# Patient Record
Sex: Female | Born: 1975 | Race: White | Hispanic: No | Marital: Single | Smoking: Former smoker
Health system: Southern US, Community
[De-identification: ages and names within clinical notes are randomized; demographics above are authoritative.]

## PROBLEM LIST (undated history)

## (undated) DIAGNOSIS — N83201 Unspecified ovarian cyst, right side: Secondary | ICD-10-CM

## (undated) DIAGNOSIS — N83202 Unspecified ovarian cyst, left side: Secondary | ICD-10-CM

## (undated) DIAGNOSIS — R87629 Unspecified abnormal cytological findings in specimens from vagina: Secondary | ICD-10-CM

## (undated) DIAGNOSIS — N189 Chronic kidney disease, unspecified: Secondary | ICD-10-CM

## (undated) DIAGNOSIS — G43909 Migraine, unspecified, not intractable, without status migrainosus: Secondary | ICD-10-CM

## (undated) HISTORY — DX: Chronic kidney disease, unspecified: N18.9

## (undated) HISTORY — PX: TONSILLECTOMY: SUR1361

## (undated) HISTORY — PX: OVARIAN CYST REMOVAL: SHX89

## (undated) HISTORY — DX: Unspecified abnormal cytological findings in specimens from vagina: R87.629

---

## 2017-06-02 ENCOUNTER — Encounter (HOSPITAL_COMMUNITY): Payer: Self-pay

## 2017-06-02 ENCOUNTER — Encounter (HOSPITAL_COMMUNITY): Payer: Self-pay | Admitting: Emergency Medicine

## 2017-06-02 ENCOUNTER — Emergency Department (HOSPITAL_COMMUNITY)
Admission: EM | Admit: 2017-06-02 | Discharge: 2017-06-02 | Disposition: A | Discharge status: Home / Self Care | Payer: Medicaid - Out of State | Attending: Emergency Medicine | Admitting: Emergency Medicine

## 2017-06-02 ENCOUNTER — Inpatient Hospital Stay (HOSPITAL_COMMUNITY)
Admission: AD | Admit: 2017-06-02 | Discharge: 2017-06-03 | Disposition: A | Discharge status: Home / Self Care | Payer: Medicaid - Out of State | Source: Ambulatory Visit | Attending: Obstetrics & Gynecology | Admitting: Obstetrics & Gynecology

## 2017-06-02 DIAGNOSIS — K5900 Constipation, unspecified: Secondary | ICD-10-CM | POA: Diagnosis not present

## 2017-06-02 DIAGNOSIS — Z79899 Other long term (current) drug therapy: Secondary | ICD-10-CM | POA: Insufficient documentation

## 2017-06-02 DIAGNOSIS — R102 Pelvic and perineal pain: Secondary | ICD-10-CM | POA: Insufficient documentation

## 2017-06-02 DIAGNOSIS — R109 Unspecified abdominal pain: Secondary | ICD-10-CM | POA: Insufficient documentation

## 2017-06-02 DIAGNOSIS — Z888 Allergy status to other drugs, medicaments and biological substances status: Secondary | ICD-10-CM | POA: Diagnosis not present

## 2017-06-02 DIAGNOSIS — Z9889 Other specified postprocedural states: Secondary | ICD-10-CM | POA: Insufficient documentation

## 2017-06-02 DIAGNOSIS — N83202 Unspecified ovarian cyst, left side: Secondary | ICD-10-CM | POA: Diagnosis not present

## 2017-06-02 DIAGNOSIS — Z87891 Personal history of nicotine dependence: Secondary | ICD-10-CM | POA: Diagnosis not present

## 2017-06-02 DIAGNOSIS — K529 Noninfective gastroenteritis and colitis, unspecified: Secondary | ICD-10-CM | POA: Diagnosis not present

## 2017-06-02 DIAGNOSIS — Z5321 Procedure and treatment not carried out due to patient leaving prior to being seen by health care provider: Secondary | ICD-10-CM | POA: Diagnosis not present

## 2017-06-02 DIAGNOSIS — N83201 Unspecified ovarian cyst, right side: Secondary | ICD-10-CM | POA: Insufficient documentation

## 2017-06-02 DIAGNOSIS — N926 Irregular menstruation, unspecified: Secondary | ICD-10-CM | POA: Insufficient documentation

## 2017-06-02 HISTORY — DX: Migraine, unspecified, not intractable, without status migrainosus: G43.909

## 2017-06-02 HISTORY — DX: Unspecified ovarian cyst, right side: N83.201

## 2017-06-02 HISTORY — DX: Unspecified ovarian cyst, left side: N83.202

## 2017-06-02 LAB — URINALYSIS, ROUTINE W REFLEX MICROSCOPIC
Bilirubin Urine: NEGATIVE
Glucose, UA: NEGATIVE mg/dL
Hgb urine dipstick: NEGATIVE
KETONES UR: NEGATIVE mg/dL
LEUKOCYTES UA: NEGATIVE
NITRITE: NEGATIVE
PROTEIN: NEGATIVE mg/dL
Specific Gravity, Urine: 1.018 (ref 1.005–1.030)
pH: 5 (ref 5.0–8.0)

## 2017-06-02 LAB — WET PREP, GENITAL
Sperm: NONE SEEN
Trich, Wet Prep: NONE SEEN
Yeast Wet Prep HPF POC: NONE SEEN

## 2017-06-02 LAB — POCT PREGNANCY, URINE: Preg Test, Ur: NEGATIVE

## 2017-06-02 MED ORDER — HYDROMORPHONE HCL 1 MG/ML IJ SOLN
1.0000 mg | INTRAMUSCULAR | Status: DC | PRN
  Administered 2017-06-03: 1 mg via INTRAVENOUS
  Filled 2017-06-02: qty 1

## 2017-06-02 MED ORDER — ONDANSETRON 4 MG PO TBDP
4.0000 mg | ORAL_TABLET | Freq: Once | ORAL | Status: AC | PRN
  Administered 2017-06-02: 4 mg via ORAL
  Filled 2017-06-02: qty 1

## 2017-06-02 NOTE — MAU Provider Note (Signed)
History     CSN: [id number]  Arrival date and time: 06/02/17 2005  Chief Complaint  Patient presents with  . Pelvic Pain   41 y.o. non-pregnant female here with LLQ pain. Pain started acutely earlier this evening after an episode of vomiting and diarrhea. Describes as constant throbbing pain and intermittent sharp pain in LLQ. Notes constipation prior to the diarrhea. No sick contacts but recent travel from [place]. Denies fevers. No urinary sx. No vaginal discharge. No new partner. Not using contraception, in same sex relationship. Had Nexplanon removed 6 months ago, irregular menses since. Most recent menses was longer and heavier.    Past Medical History:  Diagnosis Date  . Bilateral ovarian cysts   . Migraine     Past Surgical History:  Procedure Laterality Date  . OVARIAN CYST REMOVAL    . TONSILLECTOMY      No family history on file.  Social History   Tobacco Use  . Smoking status: Former Research scientist (life sciences)  . Smokeless tobacco: Never Used  Substance Use Topics  . Alcohol use: Yes    Comment: Occasionally  . Drug use: No    Allergies:  Allergies  Allergen Reactions  . Imitrex [Sumatriptan] Other (See Comments)    Migraine gets worse.     No medications prior to admission.    Review of Systems  Constitutional: Negative for fever.  Gastrointestinal: Positive for abdominal pain, constipation, diarrhea, nausea and vomiting.  Genitourinary: Negative for dysuria, frequency, urgency, vaginal bleeding and vaginal discharge.   Physical Exam   Blood pressure (!) 142/96, pulse 64, temperature 98.4 F (36.9 C), resp. rate 20, height 5\' 3"  (1.6 m), weight 132 lb (59.9 kg), last menstrual period 05/21/2017, SpO2 100 %.  Physical Exam  Constitutional: She is oriented to person, place, and time. She appears well-developed and well-nourished. She appears distressed (moving around, fetal position at times).  HENT:  Head: Normocephalic and atraumatic.  Neck: Normal range of motion.   Cardiovascular: Normal rate.  Respiratory: No respiratory distress.  GI: Soft. She exhibits no distension and no mass. There is no tenderness. There is no rebound and no guarding.  Genitourinary:  Genitourinary Comments: External: no lesions or erythema Vagina: rugated, pink, moist, scant white discharge Uterus: non enlarged, anteverted, non tender, no CMT Adnexae: no masses, + tenderness left, + tenderness right   Musculoskeletal: Normal range of motion.  Neurological: She is alert and oriented to person, place, and time.  Skin: Skin is warm and dry.  Psychiatric: She has a normal mood and affect.   Results for orders placed or performed during the hospital encounter of 06/02/17 (from the past 24 hour(s))  Urinalysis, Routine w reflex microscopic     Status: Abnormal   Collection Time: 06/02/17  8:57 PM  Result Value Ref Range   Color, Urine YELLOW YELLOW   APPearance HAZY (A) CLEAR   Specific Gravity, Urine 1.018 1.005 - 1.030   pH 5.0 5.0 - 8.0   Glucose, UA NEGATIVE NEGATIVE mg/dL   Hgb urine dipstick NEGATIVE NEGATIVE   Bilirubin Urine NEGATIVE NEGATIVE   Ketones, ur NEGATIVE NEGATIVE mg/dL   Protein, ur NEGATIVE NEGATIVE mg/dL   Nitrite NEGATIVE NEGATIVE   Leukocytes, UA NEGATIVE NEGATIVE  Pregnancy, urine POC     Status: None   Collection Time: 06/02/17  9:11 PM  Result Value Ref Range   Preg Test, Ur NEGATIVE NEGATIVE  Wet prep, genital     Status: Abnormal   Collection Time: 06/02/17 11:30 PM  Result Value Ref Range   Yeast Wet Prep HPF POC NONE SEEN NONE SEEN   Trich, Wet Prep NONE SEEN NONE SEEN   Clue Cells Wet Prep HPF POC PRESENT (A) NONE SEEN   WBC, Wet Prep HPF POC MANY (A) NONE SEEN   Sperm NONE SEEN   CBC     Status: None   Collection Time: 06/02/17 11:55 PM  Result Value Ref Range   WBC 6.7 4.0 - 10.5 K/uL   RBC 4.55 3.87 - 5.11 MIL/uL   Hemoglobin 13.7 12.0 - 15.0 g/dL   HCT 40.2 36.0 - 46.0 %   MCV 88.4 78.0 - 100.0 fL   MCH 30.1 26.0 - 34.0 pg    MCHC 34.1 30.0 - 36.0 g/dL   RDW 13.3 11.5 - 15.5 %   Platelets 325 150 - 400 K/uL   US Pelvis Transvanginal Non-ob (tv Only)  Result Date: 06/03/2017 CLINICAL DATA:  41 year old female with acute pelvic pain. EXAM: TRANSABDOMINAL AND TRANSVAGINAL ULTRASOUND OF PELVIS DOPPLER ULTRASOUND OF OVARIES TECHNIQUE: Both transabdominal and transvaginal ultrasound examinations of the pelvis were performed. Transabdominal technique was performed for global imaging of the pelvis including uterus, ovaries, adnexal regions, and pelvic cul-de-sac. It was necessary to proceed with endovaginal exam following the transabdominal exam to visualize the endometrium and ovaries. Color and duplex Doppler ultrasound was utilized to evaluate blood flow to the ovaries. COMPARISON:  None. FINDINGS: Uterus Measurements: 7.9 x 3.5 x 4.2 cm. The uterus is retroverted and appears unremarkable. Endometrium Thickness: 7 mm.  No focal abnormality visualized. Right ovary Measurements: 3.4 x 1.8 x 2.4 cm. Normal appearance/no adnexal mass. Left ovary Measurements: 3.7 x 2.4 x 3.1 cm. Normal appearance/no adnexal mass. Pulsed Doppler evaluation of both ovaries demonstrates normal low-resistance arterial and venous waveforms. Other findings Multiple nondistended fluid-filled loops of small bowel noted which may be physiologic. Clinical correlation is recommended to exclude enteritis. Small free fluid within the pelvis. IMPRESSION: 1. Unremarkable pelvic ultrasound. 2. Nondistended fluid-filled loops of small bowel, likely physiologic. Clinical correlation is recommended to exclude enteritis. Electronically Signed   By: Anner Crete M.D.   On: 06/03/2017 01:16   US Pelvis (transabdominal Only)  Result Date: 06/03/2017 CLINICAL DATA:  41 year old female with acute pelvic pain. EXAM: TRANSABDOMINAL AND TRANSVAGINAL ULTRASOUND OF PELVIS DOPPLER ULTRASOUND OF OVARIES TECHNIQUE: Both transabdominal and transvaginal ultrasound examinations  of the pelvis were performed. Transabdominal technique was performed for global imaging of the pelvis including uterus, ovaries, adnexal regions, and pelvic cul-de-sac. It was necessary to proceed with endovaginal exam following the transabdominal exam to visualize the endometrium and ovaries. Color and duplex Doppler ultrasound was utilized to evaluate blood flow to the ovaries. COMPARISON:  None. FINDINGS: Uterus Measurements: 7.9 x 3.5 x 4.2 cm. The uterus is retroverted and appears unremarkable. Endometrium Thickness: 7 mm.  No focal abnormality visualized. Right ovary Measurements: 3.4 x 1.8 x 2.4 cm. Normal appearance/no adnexal mass. Left ovary Measurements: 3.7 x 2.4 x 3.1 cm. Normal appearance/no adnexal mass. Pulsed Doppler evaluation of both ovaries demonstrates normal low-resistance arterial and venous waveforms. Other findings Multiple nondistended fluid-filled loops of small bowel noted which may be physiologic. Clinical correlation is recommended to exclude enteritis. Small free fluid within the pelvis. IMPRESSION: 1. Unremarkable pelvic ultrasound. 2. Nondistended fluid-filled loops of small bowel, likely physiologic. Clinical correlation is recommended to exclude enteritis. Electronically Signed   By: Anner Crete M.D.   On: 06/03/2017 01:16   US Pelvic Doppler (torsion R/o Or Mass  Arterial Flow)  Result Date: 06/03/2017 CLINICAL DATA:  41 year old female with acute pelvic pain. EXAM: TRANSABDOMINAL AND TRANSVAGINAL ULTRASOUND OF PELVIS DOPPLER ULTRASOUND OF OVARIES TECHNIQUE: Both transabdominal and transvaginal ultrasound examinations of the pelvis were performed. Transabdominal technique was performed for global imaging of the pelvis including uterus, ovaries, adnexal regions, and pelvic cul-de-sac. It was necessary to proceed with endovaginal exam following the transabdominal exam to visualize the endometrium and ovaries. Color and duplex Doppler ultrasound was utilized to evaluate  blood flow to the ovaries. COMPARISON:  None. FINDINGS: Uterus Measurements: 7.9 x 3.5 x 4.2 cm. The uterus is retroverted and appears unremarkable. Endometrium Thickness: 7 mm.  No focal abnormality visualized. Right ovary Measurements: 3.4 x 1.8 x 2.4 cm. Normal appearance/no adnexal mass. Left ovary Measurements: 3.7 x 2.4 x 3.1 cm. Normal appearance/no adnexal mass. Pulsed Doppler evaluation of both ovaries demonstrates normal low-resistance arterial and venous waveforms. Other findings Multiple nondistended fluid-filled loops of small bowel noted which may be physiologic. Clinical correlation is recommended to exclude enteritis. Small free fluid within the pelvis. IMPRESSION: 1. Unremarkable pelvic ultrasound. 2. Nondistended fluid-filled loops of small bowel, likely physiologic. Clinical correlation is recommended to exclude enteritis. Electronically Signed   By: Anner Crete M.D.   On: 06/03/2017 01:16   MAU Course  Procedures Dilaudid  MDM Labs and [place] ordered and reviewed. Pain improved after Dilaudid. No evidence of acute pelvic process. Sx likely caused by gastroenteritis. Discussed supportive measures. Stable for discharge home.  Assessment and Plan   1. Gastroenteritis   2. Acute pelvic pain    Discharge home Hydrate Rest BRAT diet Rx Zofran Rx Bentyl Imodium OTC prn Return for worsening sx  Allergies as of 06/03/2017      Reactions   Imitrex [sumatriptan] Other (See Comments)   Migraine gets worse.       Medication List    TAKE these medications   dicyclomine 10 MG capsule Commonly known as:  BENTYL Take 1 capsule (10 mg total) by mouth 3 (three) times daily.   ondansetron 8 MG disintegrating tablet Commonly known as:  ZOFRAN ODT Take 1 tablet (8 mg total) by mouth every 8 (eight) hours as needed for nausea or vomiting.        Aunesty Handler, CNM 06/02/2017, 11:40 PM

## 2017-06-02 NOTE — ED Notes (Signed)
Called patient for assigned room with no answer.  

## 2017-06-02 NOTE — MAU Note (Signed)
Pt reports sudden onset of nausea, vomiting and diarrhea. Soon after started having sharp, stabbing, left lower quadrant pain. States she has taken zofran for nausea, which has helped. Has not taken anything for pain. Was at [facility], but left due to wait time. Pt denies fever.

## 2017-06-02 NOTE — ED Triage Notes (Signed)
Pt comes with complaints of left lower abdominal pain that radiates upwards that began suddenly around 1400. Endorses nausea, vomiting, and diarrhea.  Took 4 mg of zofran around 1500 with some relief. Rates pain 10/10.

## 2017-06-02 NOTE — ED Notes (Signed)
Writer called for blood draws, no response

## 2017-06-02 NOTE — MAU Note (Signed)
Pt states nausea at 1400 lasting 1.5 hrs., took zofran and it resolves; pain began 1430. Went to [facility] ER; they sent her here.

## 2017-06-03 ENCOUNTER — Inpatient Hospital Stay (HOSPITAL_COMMUNITY): Payer: Medicaid - Out of State

## 2017-06-03 DIAGNOSIS — K529 Noninfective gastroenteritis and colitis, unspecified: Secondary | ICD-10-CM

## 2017-06-03 DIAGNOSIS — R102 Pelvic and perineal pain: Secondary | ICD-10-CM

## 2017-06-03 LAB — CBC
HCT: 40.2 % (ref 36.0–46.0)
HEMOGLOBIN: 13.7 g/dL (ref 12.0–15.0)
MCH: 30.1 pg (ref 26.0–34.0)
MCHC: 34.1 g/dL (ref 30.0–36.0)
MCV: 88.4 fL (ref 78.0–100.0)
Platelets: 325 10*3/uL (ref 150–400)
RBC: 4.55 MIL/uL (ref 3.87–5.11)
RDW: 13.3 % (ref 11.5–15.5)
WBC: 6.7 10*3/uL (ref 4.0–10.5)

## 2017-06-03 LAB — GC/CHLAMYDIA PROBE AMP (~~LOC~~) NOT AT ARMC
CHLAMYDIA, DNA PROBE: NEGATIVE
NEISSERIA GONORRHEA: NEGATIVE

## 2017-06-03 IMAGING — US US ART/VEN ABD/PELV/SCROTUM DOPPLER LTD
1 series · 15 of 25 positions shown · non-contrast
Comparison: None.

CLINICAL DATA: 41-year-old female with acute pelvic pain.

EXAM:
TRANSABDOMINAL AND TRANSVAGINAL ULTRASOUND OF PELVIS
DOPPLER ULTRASOUND OF OVARIES
TECHNIQUE: Both transabdominal and transvaginal ultrasound examinations of the
pelvis were performed. Transabdominal technique was performed for
global imaging of the pelvis including uterus, ovaries, adnexal
regions, and pelvic cul-de-sac.
It was necessary to proceed with endovaginal exam following the
transabdominal exam to visualize the endometrium and ovaries. Color
and duplex Doppler ultrasound was utilized to evaluate blood flow to
the ovaries.

[Series 1: us art/ven abd/pelv/scrotum doppler ltd · 15 of 58 slices shown]
[im 1/58]
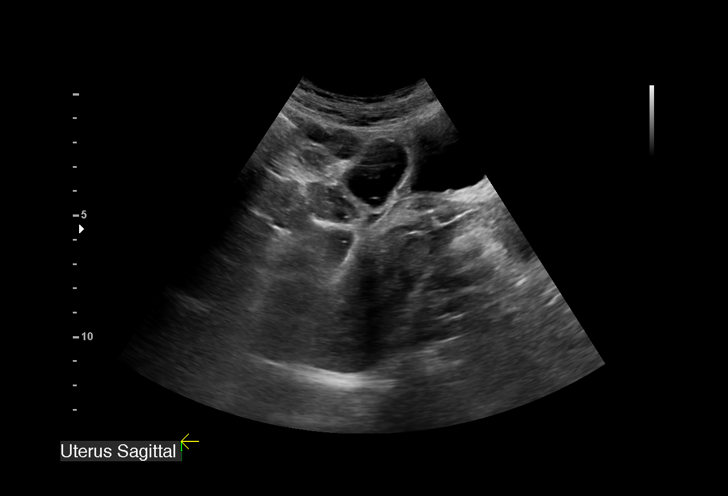
[im 5/58]
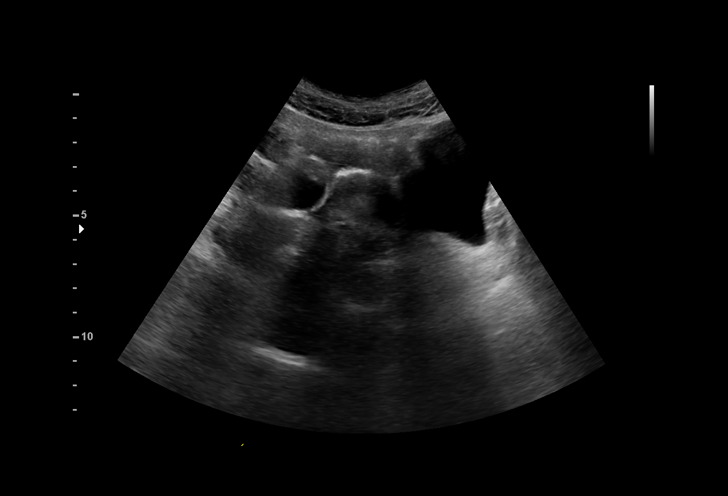
[im 10/58]
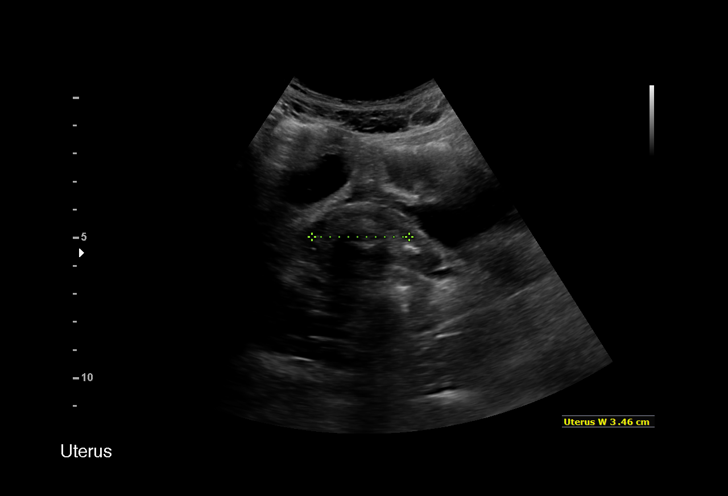
[im 12/58]
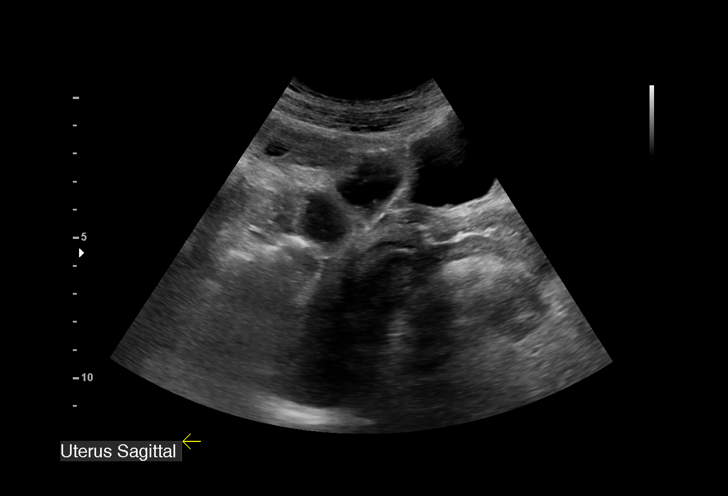
[im 17/58]
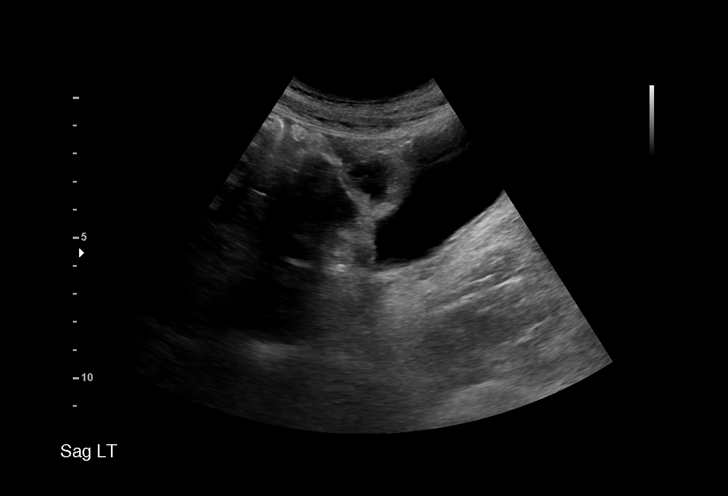
[im 22/58]
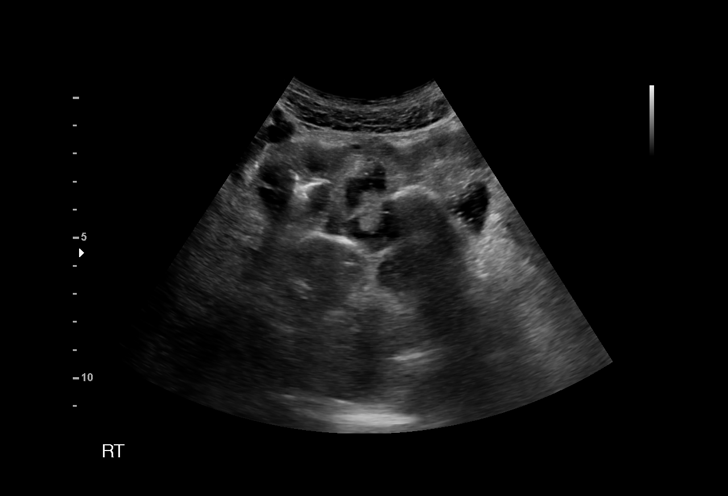
[im 24/58]
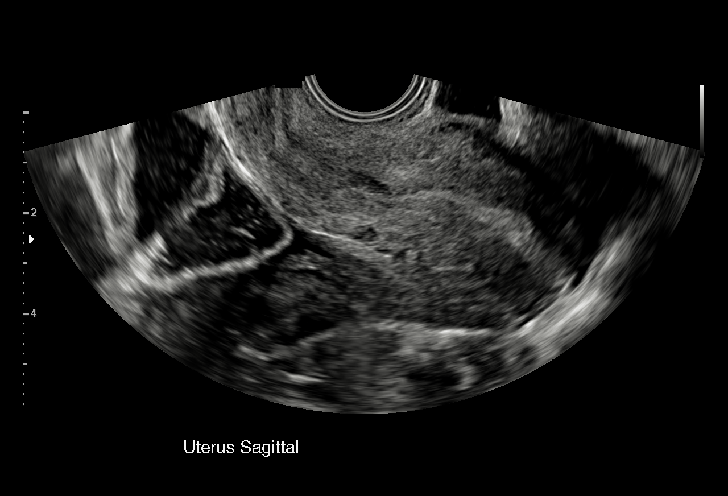
[im 29/58]
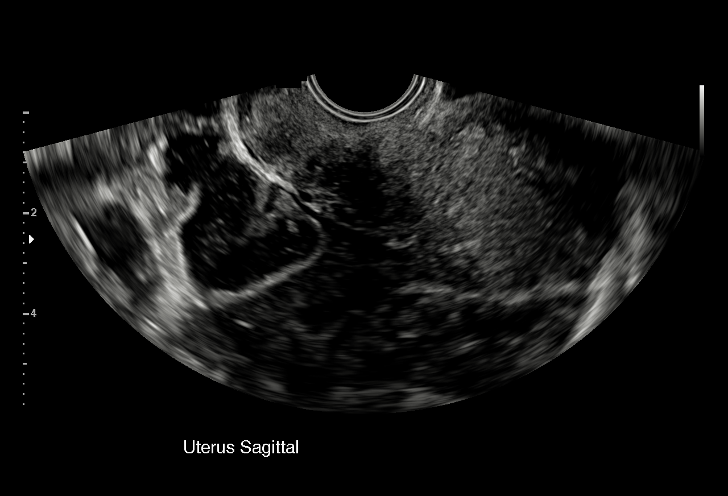
[im 34/58]
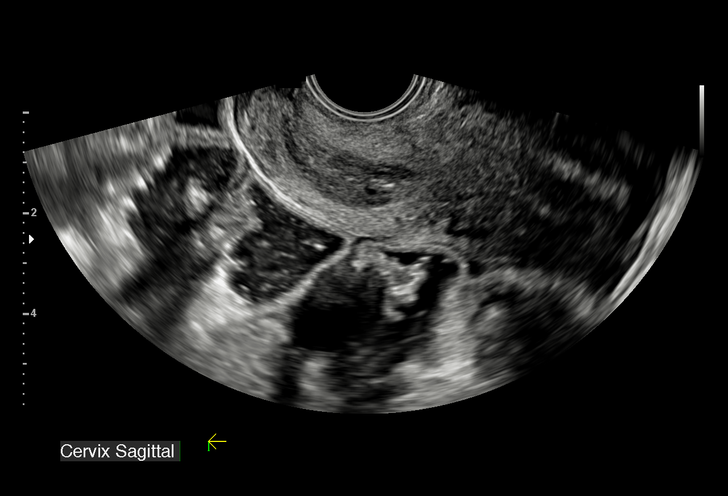
[im 36/58]
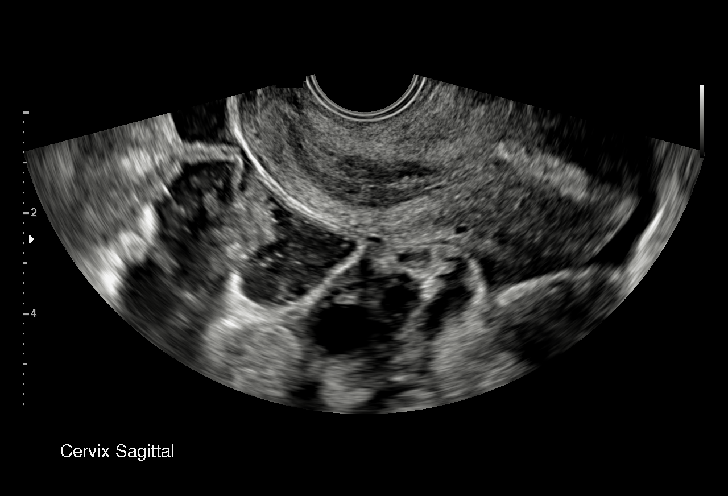
[im 41/58]
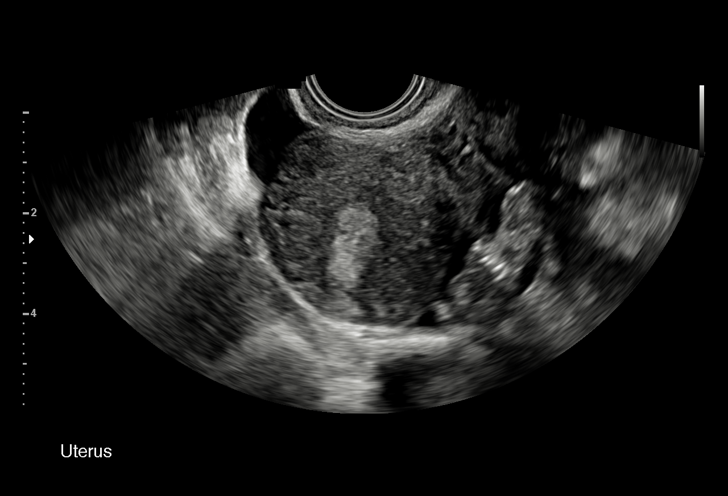
[im 46/58]
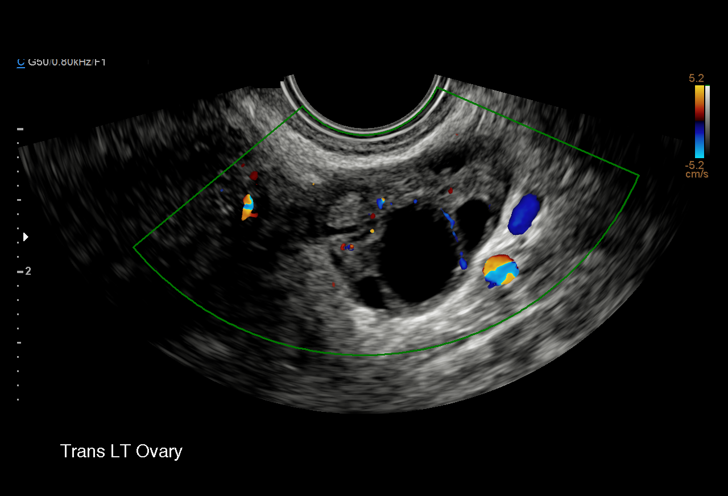
[im 48/58]
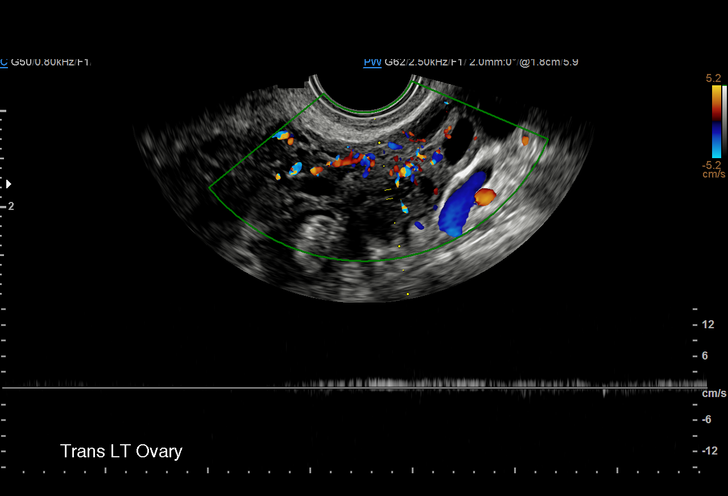
[im 53/58]
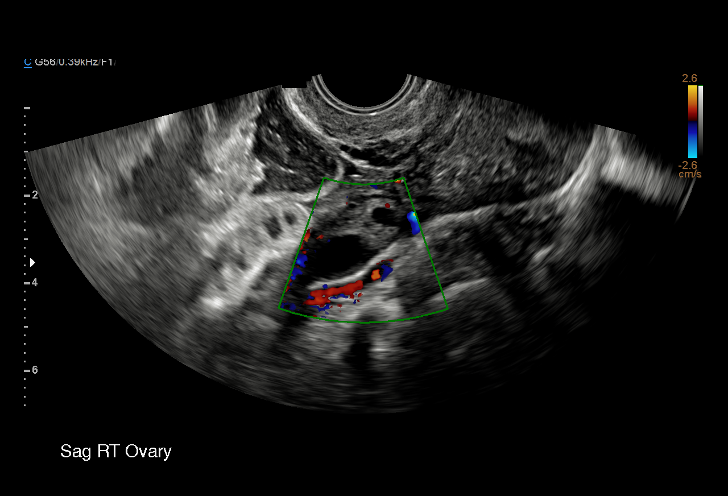
[im 58/58]
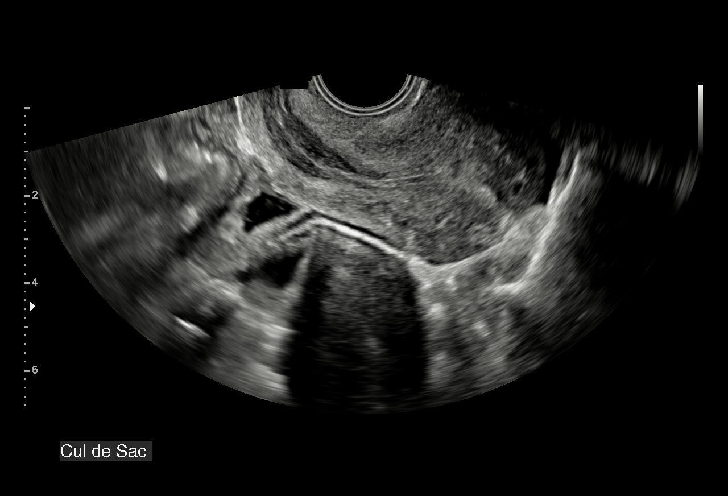

[15 of 25 positions shown; findings below may reference images not displayed]

FINDINGS: Uterus

Measurements: 7.9 x 3.5 x 4.2 cm. The uterus is retroverted and
appears unremarkable.

Endometrium

Thickness: 7 mm.  No focal abnormality visualized.

Right ovary

Measurements: 3.4 x 1.8 x 2.4 cm. Normal appearance/no adnexal mass.

Left ovary

Measurements: 3.7 x 2.4 x 3.1 cm. Normal appearance/no adnexal mass.

Pulsed Doppler evaluation of both ovaries demonstrates normal
low-resistance arterial and venous waveforms.

Other findings

Multiple nondistended fluid-filled loops of small bowel noted which
may be physiologic. Clinical correlation is recommended to exclude
enteritis.

Small free fluid within the pelvis.
IMPRESSION: 1. Unremarkable pelvic ultrasound.
2. Nondistended fluid-filled loops of small bowel, likely
physiologic. Clinical correlation is recommended to exclude
enteritis.

## 2017-06-03 MED ORDER — DICYCLOMINE HCL 10 MG PO CAPS: 10.0000 mg | ORAL_CAPSULE | Freq: Three times a day (TID) | ORAL | 0 refills | Status: DC

## 2017-06-03 MED ORDER — ONDANSETRON 8 MG PO TBDP: 8.0000 mg | ORAL_TABLET | Freq: Three times a day (TID) | ORAL | 0 refills | Status: DC | PRN

## 2017-06-03 MED ORDER — DICYCLOMINE HCL 10 MG PO CAPS
10.0000 mg | ORAL_CAPSULE | Freq: Three times a day (TID) | ORAL | Status: DC
  Administered 2017-06-03: 10 mg via ORAL
  Filled 2017-06-03 (×3): qty 1

## 2017-06-03 NOTE — Discharge Instructions (Signed)
Bland Diet A bland diet consists of foods that do not have a lot of fat or fiber. Foods without fat or fiber are easier for the body to digest. They are also less likely to irritate your mouth, throat, stomach, and other parts of your gastrointestinal tract. A bland diet is sometimes called a BRAT diet. What is my plan? Your health care provider or dietitian may recommend specific changes to your diet to prevent and treat your symptoms, such as:  Eating small meals often.  Cooking food until it is soft enough to chew easily.  Chewing your food well.  Drinking fluids slowly.  Not eating foods that are very spicy, sour, or fatty.  Not eating citrus fruits, such as oranges and grapefruit.  What do I need to know about this diet?  Eat a variety of foods from the bland diet food list.  Do not follow a bland diet longer than you have to.  Ask your health care provider whether you should take vitamins. What foods can I eat? Grains  Hot cereals, such as cream of wheat. Bread, crackers, or tortillas made from refined white flour. Rice. Vegetables Canned or cooked vegetables. Mashed or boiled potatoes. Fruits Bananas. Applesauce. Other types of cooked or canned fruit with the skin and seeds removed, such as canned peaches or pears. Meats and Other Protein Sources Scrambled eggs. Creamy peanut butter or other nut butters. Lean, well-cooked meats, such as chicken or fish. Tofu. Soups or broths. Dairy Low-fat dairy products, such as milk, cottage cheese, or yogurt. Beverages Water. Herbal tea. Apple juice. Sweets and Desserts Pudding. Custard. Fruit gelatin. Ice cream. Fats and Oils Mild salad dressings. Canola or olive oil. The items listed above may not be a complete list of allowed foods or beverages. Contact your dietitian for more options. What foods are not recommended? Foods and ingredients that are often not recommended include:  Spicy foods, such as hot sauce or  salsa.  Fried foods.  Sour foods, such as pickled or fermented foods.  Raw vegetables or fruits, especially citrus or berries.  Caffeinated drinks.  Alcohol.  Strongly flavored seasonings or condiments.  The items listed above may not be a complete list of foods and beverages that are not allowed. Contact your dietitian for more information. This information is not intended to replace advice given to you by your health care provider. Make sure you discuss any questions you have with your health care provider. Document Released: 09/16/2015 Document Revised: 10/31/2015 Document Reviewed: 06/06/2014 Elsevier Interactive Patient Education  2018 [employer] Viral Gastroenteritis, Adult Viral gastroenteritis is also known as the stomach flu. This condition is caused by certain germs (viruses). These germs can be passed from person to person very easily (are very contagious). This condition can cause sudden watery poop (diarrhea), fever, and throwing up (vomiting). Having watery poop and throwing up can make you feel weak and cause you to get dehydrated. Dehydration can make you tired and thirsty, make you have a dry mouth, and make it so you pee (urinate) less often. Older adults and people with other diseases or a weak defense system (immune system) are at higher risk for dehydration. It is important to replace the fluids that you lose from having watery poop and throwing up. Follow these instructions at home: Follow instructions from your doctor about how to care for yourself at home. Eating and drinking  Follow these instructions as told by your doctor:  Take an oral rehydration solution (ORS). This is   a drink that is sold at pharmacies and stores.  Drink clear fluids in small amounts as you are able, such as: ? Water. ? Ice chips. ? Diluted fruit juice. ? Low-calorie sports drinks.  Eat bland, easy-to-digest foods in small amounts as you are able, such  as: ? Bananas. ? Applesauce. ? Rice. ? Low-fat (lean) meats. ? Toast. ? Crackers.  Avoid fluids that have a lot of sugar or caffeine in them.  Avoid alcohol.  Avoid spicy or fatty foods.  General instructions  Drink enough fluid to keep your pee (urine) clear or pale yellow.  Wash your hands often. If you cannot use soap and water, use hand sanitizer.  Make sure that all people in your home wash their hands well and often.  Rest at home while you get better.  Take over-the-counter and prescription medicines only as told by your doctor.  Watch your condition for any changes.  Take a warm bath to help with any burning or pain from having watery poop.  Keep all follow-up visits as told by your doctor. This is important. Contact a doctor if:  You cannot keep fluids down.  Your symptoms get worse.  You have new symptoms.  You feel light-headed or dizzy.  You have muscle cramps. Get help right away if:  You have chest pain.  You feel very weak or you pass out (faint).  You see blood in your throw-up.  Your throw-up looks like coffee grounds.  You have bloody or black poop (stools) or poop that look like tar.  You have a very bad headache, a stiff neck, or both.  You have a rash.  You have very bad pain, cramping, or bloating in your belly (abdomen).  You have trouble breathing.  You are breathing very quickly.  Your heart is beating very quickly.  Your skin feels cold and clammy.  You feel confused.  You have pain when you pee.  You have signs of dehydration, such as: ? Dark pee, hardly any pee, or no pee. ? Cracked lips. ? Dry mouth. ? Sunken eyes. ? Sleepiness. ? Weakness. This information is not intended to replace advice given to you by your health care provider. Make sure you discuss any questions you have with your health care provider. Document Released: 11/11/2007 Document Revised: 12/13/2015 Document Reviewed: 01/29/2015 Elsevier  Interactive Patient Education  2017 [employer] Inc[employer]  

## 2018-10-15 ENCOUNTER — Other Ambulatory Visit: Payer: Self-pay

## 2018-10-15 ENCOUNTER — Ambulatory Visit (HOSPITAL_COMMUNITY)
Admission: EM | Admit: 2018-10-15 | Discharge: 2018-10-15 | Disposition: A | Discharge status: Home / Self Care | Payer: Medicaid Other | Attending: Family Medicine | Admitting: Family Medicine

## 2018-10-15 DIAGNOSIS — R350 Frequency of micturition: Secondary | ICD-10-CM

## 2018-10-15 DIAGNOSIS — M545 Low back pain, unspecified: Secondary | ICD-10-CM

## 2018-10-15 LAB — POCT URINALYSIS DIP (DEVICE)
Bilirubin Urine: NEGATIVE
Glucose, UA: NEGATIVE mg/dL
Hgb urine dipstick: NEGATIVE
Ketones, ur: NEGATIVE mg/dL
Leukocytes,Ua: NEGATIVE
Nitrite: NEGATIVE
Protein, ur: NEGATIVE mg/dL
Specific Gravity, Urine: 1.01 (ref 1.005–1.030)
Urobilinogen, UA: 0.2 mg/dL (ref 0.0–1.0)
pH: 7 (ref 5.0–8.0)

## 2018-10-15 LAB — POCT PREGNANCY, URINE: Preg Test, Ur: NEGATIVE

## 2018-10-15 MED ORDER — CYCLOBENZAPRINE HCL 5 MG PO TABS: 5.0000 mg | ORAL_TABLET | Freq: Three times a day (TID) | ORAL | 0 refills | Status: DC | PRN

## 2018-10-15 MED ORDER — PREDNISONE 20 MG PO TABS: 40.0000 mg | ORAL_TABLET | Freq: Every day | ORAL | 0 refills | Status: AC

## 2018-10-15 NOTE — ED Provider Notes (Signed)
[facility]    CSN: [id number] Arrival date & time: 10/15/18  1128     History    Musculoskeletal Exam  Patient: Vanessa Reese DOB: [birth year]  DOS: 10/15/2018  SUBJECTIVE:  Chief Complaint:   Chief Complaint  Patient presents with  . Flank Pain    Vanessa Reese is a 43 y.o.  female for evaluation and treatment of b/l blank pain.   Onset:  1 week ago. No inj or change in activity.  Location:  Character:  aching  Progression of issue:  is unchanged Associated symptoms: urinary freq, incomplete emptying Denies fevers, bleeding, abd pain, vag dc, dysuria Treatment: to date has been Tylenol and heat.   Neurovascular symptoms: no  Last A1c was 5.3. Told she has CKD 3, labs show CKD 2.  Has never had UTI that felt like this. Has never had pyeloneph.  ROS: Musculoskeletal/Extremities: +flank pain GU: No dysuria  Past Medical History:  Diagnosis Date  . Bilateral ovarian cysts   . Migraine     Objective: VITAL SIGNS: BP (!) 152/89 (BP Location: Right Arm)   Pulse 74   Temp 98.4 F (36.9 C) (Oral)   Resp 16   SpO2 99%  Constitutional: Well formed, well developed. No acute distress. Cardiovascular: Brisk cap refill Thorax & Lungs: No accessory muscle use Musculoskeletal: low back.   Tenderness to palpation: Yes, b/l parasp musculature, worse on R Deformity: no Ecchymosis: no Tests negative: Straight leg Poor hamstring flexibility Neurologic: Normal sensory function. No focal deficits noted. DTR's equal and symmetric in LE's. No clonus. Psychiatric: Normal mood. Age appropriate judgment and insight. Alert & oriented x 3.  Final Clinical Impressions(s) / UC Diagnoses   Final diagnoses:  Acute bilateral low back pain without sciatica  Urinary frequency   Tx for msk etiology. Will unfortunately have to f/u with PCP regarding her fatigue and freq. Further instructions as below.    Discharge Instructions     Heat (pad or rice pillow in  microwave) over affected area, 10-15 minutes twice daily.   OK to take Tylenol 1000 mg (2 extra strength tabs) or 975 mg (3 regular strength tabs) every 6 hours as needed.  Take Flexeril (cyclobenzaprine) 1-2 hours before planned bedtime. If it makes you drowsy, do not take during the day. You can try half a tab the following night.  EXERCISES  RANGE OF MOTION (ROM) AND STRETCHING EXERCISES - Low Back Pain Most people with lower back pain will find that their symptoms get worse with excessive bending forward (flexion) or arching at the lower back (extension). The exercises that will help resolve your symptoms will focus on the opposite motion.  If you have pain, numbness or tingling which travels down into your buttocks, leg or foot, the goal of the therapy is for these symptoms to move closer to your back and eventually resolve. Sometimes, these leg symptoms will get better, but your lower back pain may worsen. This is often an indication of progress in your rehabilitation. Be very alert to any changes in your symptoms and the activities in which you participated in the 24 hours prior to the change. Sharing this information with your caregiver will allow him or her to most efficiently treat your condition. These exercises may help you when beginning to rehabilitate your injury. Your symptoms may resolve with or without further involvement from your physician, physical therapist or athletic trainer. While completing these exercises, remember:   Restoring tissue flexibility helps normal motion to return  to the joints. This allows healthier, less painful movement and activity.  An effective stretch should be held for at least 30 seconds.  A stretch should never be painful. You should only feel a gentle lengthening or release in the stretched tissue. FLEXION RANGE OF MOTION AND STRETCHING EXERCISES:  STRETCH - Flexion, Single Knee to Chest   Lie on a firm bed or floor with both legs extended in  front of you.  Keeping one leg in contact with the floor, bring your opposite knee to your chest. Hold your leg in place by either grabbing behind your thigh or at your knee.  Pull until you feel a gentle stretch in your low back. Hold 30 seconds.  Slowly release your grasp and repeat the exercise with the opposite side. Repeat 2 times. Complete this exercise 3 times per week.   STRETCH - Flexion, Double Knee to Chest  Lie on a firm bed or floor with both legs extended in front of you.  Keeping one leg in contact with the floor, bring your opposite knee to your chest.  Tense your stomach muscles to support your back and then lift your other knee to your chest. Hold your legs in place by either grabbing behind your thighs or at your knees.  Pull both knees toward your chest until you feel a gentle stretch in your low back. Hold 30 seconds.  Tense your stomach muscles and slowly return one leg at a time to the floor. Repeat 2 times. Complete this exercise 3 times per week.   STRETCH - Low Trunk Rotation  Lie on a firm bed or floor. Keeping your legs in front of you, bend your knees so they are both pointed toward the ceiling and your feet are flat on the floor.  Extend your arms out to the side. This will stabilize your upper body by keeping your shoulders in contact with the floor.  Gently and slowly drop both knees together to one side until you feel a gentle stretch in your low back. Hold for 30 seconds.  Tense your stomach muscles to support your lower back as you bring your knees back to the starting position. Repeat the exercise to the other side. Repeat 2 times. Complete this exercise at least 3 times per week.   EXTENSION RANGE OF MOTION AND FLEXIBILITY EXERCISES:  STRETCH - Extension, Prone on Elbows   Lie on your stomach on the floor, a bed will be too soft. Place your palms about shoulder width apart and at the height of your head.  Place your elbows under your  shoulders. If this is too painful, stack pillows under your chest.  Allow your body to relax so that your hips drop lower and make contact more completely with the floor.  Hold this position for 30 seconds.  Slowly return to lying flat on the floor. Repeat 2 times. Complete this exercise 3 times per week.   RANGE OF MOTION - Extension, Prone Press Ups  Lie on your stomach on the floor, a bed will be too soft. Place your palms about shoulder width apart and at the height of your head.  Keeping your back as relaxed as possible, slowly straighten your elbows while keeping your hips on the floor. You may adjust the placement of your hands to maximize your comfort. As you gain motion, your hands will come more underneath your shoulders.  Hold this position 30 seconds.  Slowly return to lying flat on the floor. Repeat 2  times. Complete this exercise 3 times per week.   RANGE OF MOTION- Quadruped, Neutral Spine   Assume a hands and knees position on a firm surface. Keep your hands under your shoulders and your knees under your hips. You may place padding under your knees for comfort.  Drop your head and point your tailbone toward the ground below you. This will round out your lower back like an angry cat. Hold this position for 30 seconds.  Slowly lift your head and release your tail bone so that your back sags into a large arch, like an old horse.  Hold this position for 30 seconds.  Repeat this until you feel limber in your low back.  Now, find your "sweet spot." This will be the most comfortable position somewhere between the two previous positions. This is your neutral spine. Once you have found this position, tense your stomach muscles to support your low back.  Hold this position for 30 seconds. Repeat 2 times. Complete this exercise 3 times per week.   STRENGTHENING EXERCISES - Low Back Sprain These exercises may help you when beginning to rehabilitate your injury. These  exercises should be done near your "sweet spot." This is the neutral, low-back arch, somewhere between fully rounded and fully arched, that is your least painful position. When performed in this safe range of motion, these exercises can be used for people who have either a flexion or extension based injury. These exercises may resolve your symptoms with or without further involvement from your physician, physical therapist or athletic trainer. While completing these exercises, remember:   Muscles can gain both the endurance and the strength needed for everyday activities through controlled exercises.  Complete these exercises as instructed by your physician, physical therapist or athletic trainer. Increase the resistance and repetitions only as guided.  You may experience muscle soreness or fatigue, but the pain or discomfort you are trying to eliminate should never worsen during these exercises. If this pain does worsen, stop and make certain you are following the directions exactly. If the pain is still present after adjustments, discontinue the exercise until you can discuss the trouble with your caregiver.  STRENGTHENING - Deep Abdominals, Pelvic Tilt   Lie on a firm bed or floor. Keeping your legs in front of you, bend your knees so they are both pointed toward the ceiling and your feet are flat on the floor.  Tense your lower abdominal muscles to press your low back into the floor. This motion will rotate your pelvis so that your tail bone is scooping upwards rather than pointing at your feet or into the floor. With a gentle tension and even breathing, hold this position for 3 seconds. Repeat 2 times. Complete this exercise 3 times per week.   STRENGTHENING - Abdominals, Crunches   Lie on a firm bed or floor. Keeping your legs in front of you, bend your knees so they are both pointed toward the ceiling and your feet are flat on the floor. Cross your arms over your chest.  Slightly tip your  chin down without bending your neck.  Tense your abdominals and slowly lift your trunk high enough to just clear your shoulder blades. Lifting higher can put excessive stress on the lower back and does not further strengthen your abdominal muscles.  Control your return to the starting position. Repeat 2 times. Complete this exercise 3 times per week.   STRENGTHENING - Quadruped, Opposite UE/LE Lift   Assume a hands and knees  position on a firm surface. Keep your hands under your shoulders and your knees under your hips. You may place padding under your knees for comfort.  Find your neutral spine and gently tense your abdominal muscles so that you can maintain this position. Your shoulders and hips should form a rectangle that is parallel with the floor and is not twisted.  Keeping your trunk steady, lift your right hand no higher than your shoulder and then your left leg no higher than your hip. Make sure you are not holding your breath. Hold this position for 30 seconds.  Continuing to keep your abdominal muscles tense and your back steady, slowly return to your starting position. Repeat with the opposite arm and leg. Repeat 2 times. Complete this exercise 3 times per week.   STRENGTHENING - Abdominals and Quadriceps, Straight Leg Raise   Lie on a firm bed or floor with both legs extended in front of you.  Keeping one leg in contact with the floor, bend the other knee so that your foot can rest flat on the floor.  Find your neutral spine, and tense your abdominal muscles to maintain your spinal position throughout the exercise.  Slowly lift your straight leg off the floor about 6 inches for a count of 3, making sure to not hold your breath.  Still keeping your neutral spine, slowly lower your leg all the way to the floor. Repeat this exercise with each leg 2 times. Complete this exercise 3 times per week.  POSTURE AND BODY MECHANICS CONSIDERATIONS - Low Back Sprain Keeping correct  posture when sitting, standing or completing your activities will reduce the stress put on different body tissues, allowing injured tissues a chance to heal and limiting painful experiences. The following are general guidelines for improved posture.  While reading these guidelines, remember:  The exercises prescribed by your provider will help you have the flexibility and strength to maintain correct postures.  The correct posture provides the best environment for your joints to work. All of your joints have less wear and tear when properly supported by a spine with good posture. This means you will experience a healthier, less painful body.  Correct posture must be practiced with all of your activities, especially prolonged sitting and standing. Correct posture is as important when doing repetitive low-stress activities (typing) as it is when doing a single heavy-load activity (lifting).  RESTING POSITIONS Consider which positions are most painful for you when choosing a resting position. If you have pain with flexion-based activities (sitting, bending, stooping, squatting), choose a position that allows you to rest in a less flexed posture. You would want to avoid curling into a fetal position on your side. If your pain worsens with extension-based activities (prolonged standing, working overhead), avoid resting in an extended position such as sleeping on your stomach. Most people will find more comfort when they rest with their spine in a more neutral position, neither too rounded nor too arched. Lying on a non-sagging bed on your side with a pillow between your knees, or on your back with a pillow under your knees will often provide some relief. Keep in mind, being in any one position for a prolonged period of time, no matter how correct your posture, can still lead to stiffness.  PROPER SITTING POSTURE In order to minimize stress and discomfort on your spine, you must sit with correct posture.  Sitting with good posture should be effortless for a healthy body. Returning to good posture is  a gradual process. Many people can work toward this most comfortably by using various supports until they have the flexibility and strength to maintain this posture on their own. When sitting with proper posture, your ears will fall over your shoulders and your shoulders will fall over your hips. You should use the back of the chair to support your upper back. Your lower back will be in a neutral position, just slightly arched. You may place a small pillow or folded towel at the base of your lower back for  support.  When working at a desk, create an environment that supports good, upright posture. Without extra support, muscles tire, which leads to excessive strain on joints and other tissues. Keep these recommendations in mind:  CHAIR:  A chair should be able to slide under your desk when your back makes contact with the back of the chair. This allows you to work closely.  The chair's height should allow your eyes to be level with the upper part of your monitor and your hands to be slightly lower than your elbows.  BODY POSITION  Your feet should make contact with the floor. If this is not possible, use a foot rest.  Keep your ears over your shoulders. This will reduce stress on your neck and low back.  INCORRECT SITTING POSTURES  If you are feeling tired and unable to assume a healthy sitting posture, do not slouch or slump. This puts excessive strain on your back tissues, causing more damage and pain. Healthier options include:  Using more support, like a lumbar pillow.  Switching tasks to something that requires you to be upright or walking.  Talking a brief walk.  Lying down to rest in a neutral-spine position.  PROLONGED STANDING WHILE SLIGHTLY LEANING FORWARD  When completing a task that requires you to lean forward while standing in one place for a long time, place either foot up on a  stationary 2-4 inch high object to help maintain the best posture. When both feet are on the ground, the lower back tends to lose its slight inward curve. If this curve flattens (or becomes too large), then the back and your other joints will experience too much stress, tire more quickly, and can cause pain.  CORRECT STANDING POSTURES Proper standing posture should be assumed with all daily activities, even if they only take a few moments, like when brushing your teeth. As in sitting, your ears should fall over your shoulders and your shoulders should fall over your hips. You should keep a slight tension in your abdominal muscles to brace your spine. Your tailbone should point down to the ground, not behind your body, resulting in an over-extended swayback posture.   INCORRECT STANDING POSTURES  Common incorrect standing postures include a forward head, locked knees and/or an excessive swayback. WALKING Walk with an upright posture. Your ears, shoulders and hips should all line-up.  PROLONGED ACTIVITY IN A FLEXED POSITION When completing a task that requires you to bend forward at your waist or lean over a low surface, try to find a way to stabilize 3 out of 4 of your limbs. You can place a hand or elbow on your thigh or rest a knee on the surface you are reaching across. This will provide you more stability, so that your muscles do not tire as quickly. By keeping your knees relaxed, or slightly bent, you will also reduce stress across your lower back. CORRECT LIFTING TECHNIQUES  DO :  Assume a wide  stance. This will provide you more stability and the opportunity to get as close as possible to the object which you are lifting.  Tense your abdominals to brace your spine. Bend at the knees and hips. Keeping your back locked in a neutral-spine position, lift using your leg muscles. Lift with your legs, keeping your back straight.  Test the weight of unknown objects before attempting to lift them.   Try to keep your elbows locked down at your sides in order get the best strength from your shoulders when carrying an object.     Always ask for help when lifting heavy or awkward objects. INCORRECT LIFTING TECHNIQUES DO NOT:   Lock your knees when lifting, even if it is a small object.  Bend and twist. Pivot at your feet or move your feet when needing to change directions.  Assume that you can safely pick up even a paperclip without proper posture.     ED Prescriptions    Medication Sig Dispense Auth. Provider   cyclobenzaprine (FLEXERIL) 5 MG tablet Take 1 tablet (5 mg total) by mouth 3 (three) times daily as needed for muscle spasms. 21 tablet Shelda Pal, DO   predniSONE (DELTASONE) 20 MG tablet Take 2 tablets (40 mg total) by mouth daily with breakfast for 5 days. 10 tablet Shelda Pal, DO     Controlled Substance Prescriptions [place] Controlled Substance Registry consulted? Not Applicable   Shelda Pal, DO 10/15/18 1245

## 2018-10-15 NOTE — Discharge Instructions (Signed)
Heat (pad or rice pillow in microwave) over affected area, 10-15 minutes twice daily.   OK to take Tylenol 1000 mg (2 extra strength tabs) or 975 mg (3 regular strength tabs) every 6 hours as needed.  Take Flexeril (cyclobenzaprine) 1-2 hours before planned bedtime. If it makes you drowsy, do not take during the day. You can try half a tab the following night.  EXERCISES  RANGE OF MOTION (ROM) AND STRETCHING EXERCISES - Low Back Pain Most people with lower back pain will find that their symptoms get worse with excessive bending forward (flexion) or arching at the lower back (extension). The exercises that will help resolve your symptoms will focus on the opposite motion.  If you have pain, numbness or tingling which travels down into your buttocks, leg or foot, the goal of the therapy is for these symptoms to move closer to your back and eventually resolve. Sometimes, these leg symptoms will get better, but your lower back pain may worsen. This is often an indication of progress in your rehabilitation. Be very alert to any changes in your symptoms and the activities in which you participated in the 24 hours prior to the change. Sharing this information with your caregiver will allow him or her to most efficiently treat your condition. These exercises may help you when beginning to rehabilitate your injury. Your symptoms may resolve with or without further involvement from your physician, physical therapist or athletic trainer. While completing these exercises, remember:  Restoring tissue flexibility helps normal motion to return to the joints. This allows healthier, less painful movement and activity. An effective stretch should be held for at least 30 seconds. A stretch should never be painful. You should only feel a gentle lengthening or release in the stretched tissue. FLEXION RANGE OF MOTION AND STRETCHING EXERCISES:  STRETCH - Flexion, Single Knee to Chest  Lie on a firm bed or floor with both  legs extended in front of you. Keeping one leg in contact with the floor, bring your opposite knee to your chest. Hold your leg in place by either grabbing behind your thigh or at your knee. Pull until you feel a gentle stretch in your low back. Hold 30 seconds. Slowly release your grasp and repeat the exercise with the opposite side. Repeat 2 times. Complete this exercise 3 times per week.   STRETCH - Flexion, Double Knee to Chest Lie on a firm bed or floor with both legs extended in front of you. Keeping one leg in contact with the floor, bring your opposite knee to your chest. Tense your stomach muscles to support your back and then lift your other knee to your chest. Hold your legs in place by either grabbing behind your thighs or at your knees. Pull both knees toward your chest until you feel a gentle stretch in your low back. Hold 30 seconds. Tense your stomach muscles and slowly return one leg at a time to the floor. Repeat 2 times. Complete this exercise 3 times per week.   STRETCH - Low Trunk Rotation Lie on a firm bed or floor. Keeping your legs in front of you, bend your knees so they are both pointed toward the ceiling and your feet are flat on the floor. Extend your arms out to the side. This will stabilize your upper body by keeping your shoulders in contact with the floor. Gently and slowly drop both knees together to one side until you feel a gentle stretch in your low back. Hold for  30 seconds. Tense your stomach muscles to support your lower back as you bring your knees back to the starting position. Repeat the exercise to the other side. Repeat 2 times. Complete this exercise at least 3 times per week.   EXTENSION RANGE OF MOTION AND FLEXIBILITY EXERCISES:  STRETCH - Extension, Prone on Elbows  Lie on your stomach on the floor, a bed will be too soft. Place your palms about shoulder width apart and at the height of your head. Place your elbows under your shoulders. If this  is too painful, stack pillows under your chest. Allow your body to relax so that your hips drop lower and make contact more completely with the floor. Hold this position for 30 seconds. Slowly return to lying flat on the floor. Repeat 2 times. Complete this exercise 3 times per week.   RANGE OF MOTION - Extension, Prone Press Ups Lie on your stomach on the floor, a bed will be too soft. Place your palms about shoulder width apart and at the height of your head. Keeping your back as relaxed as possible, slowly straighten your elbows while keeping your hips on the floor. You may adjust the placement of your hands to maximize your comfort. As you gain motion, your hands will come more underneath your shoulders. Hold this position 30 seconds. Slowly return to lying flat on the floor. Repeat 2 times. Complete this exercise 3 times per week.   RANGE OF MOTION- Quadruped, Neutral Spine  Assume a hands and knees position on a firm surface. Keep your hands under your shoulders and your knees under your hips. You may place padding under your knees for comfort. Drop your head and point your tailbone toward the ground below you. This will round out your lower back like an angry cat. Hold this position for 30 seconds. Slowly lift your head and release your tail bone so that your back sags into a large arch, like an old horse. Hold this position for 30 seconds. Repeat this until you feel limber in your low back. Now, find your "sweet spot." This will be the most comfortable position somewhere between the two previous positions. This is your neutral spine. Once you have found this position, tense your stomach muscles to support your low back. Hold this position for 30 seconds. Repeat 2 times. Complete this exercise 3 times per week.   STRENGTHENING EXERCISES - Low Back Sprain These exercises may help you when beginning to rehabilitate your injury. These exercises should be done near your "sweet spot." This  is the neutral, low-back arch, somewhere between fully rounded and fully arched, that is your least painful position. When performed in this safe range of motion, these exercises can be used for people who have either a flexion or extension based injury. These exercises may resolve your symptoms with or without further involvement from your physician, physical therapist or athletic trainer. While completing these exercises, remember:  Muscles can gain both the endurance and the strength needed for everyday activities through controlled exercises. Complete these exercises as instructed by your physician, physical therapist or athletic trainer. Increase the resistance and repetitions only as guided. You may experience muscle soreness or fatigue, but the pain or discomfort you are trying to eliminate should never worsen during these exercises. If this pain does worsen, stop and make certain you are following the directions exactly. If the pain is still present after adjustments, discontinue the exercise until you can discuss the trouble with your caregiver.  STRENGTHENING - Deep Abdominals, Pelvic Tilt  Lie on a firm bed or floor. Keeping your legs in front of you, bend your knees so they are both pointed toward the ceiling and your feet are flat on the floor. Tense your lower abdominal muscles to press your low back into the floor. This motion will rotate your pelvis so that your tail bone is scooping upwards rather than pointing at your feet or into the floor. With a gentle tension and even breathing, hold this position for 3 seconds. Repeat 2 times. Complete this exercise 3 times per week.   STRENGTHENING - Abdominals, Crunches  Lie on a firm bed or floor. Keeping your legs in front of you, bend your knees so they are both pointed toward the ceiling and your feet are flat on the floor. Cross your arms over your chest. Slightly tip your chin down without bending your neck. Tense your abdominals and  slowly lift your trunk high enough to just clear your shoulder blades. Lifting higher can put excessive stress on the lower back and does not further strengthen your abdominal muscles. Control your return to the starting position. Repeat 2 times. Complete this exercise 3 times per week.   STRENGTHENING - Quadruped, Opposite UE/LE Lift  Assume a hands and knees position on a firm surface. Keep your hands under your shoulders and your knees under your hips. You may place padding under your knees for comfort. Find your neutral spine and gently tense your abdominal muscles so that you can maintain this position. Your shoulders and hips should form a rectangle that is parallel with the floor and is not twisted. Keeping your trunk steady, lift your right hand no higher than your shoulder and then your left leg no higher than your hip. Make sure you are not holding your breath. Hold this position for 30 seconds. Continuing to keep your abdominal muscles tense and your back steady, slowly return to your starting position. Repeat with the opposite arm and leg. Repeat 2 times. Complete this exercise 3 times per week.   STRENGTHENING - Abdominals and Quadriceps, Straight Leg Raise  Lie on a firm bed or floor with both legs extended in front of you. Keeping one leg in contact with the floor, bend the other knee so that your foot can rest flat on the floor. Find your neutral spine, and tense your abdominal muscles to maintain your spinal position throughout the exercise. Slowly lift your straight leg off the floor about 6 inches for a count of 3, making sure to not hold your breath. Still keeping your neutral spine, slowly lower your leg all the way to the floor. Repeat this exercise with each leg 2 times. Complete this exercise 3 times per week.  POSTURE AND BODY MECHANICS CONSIDERATIONS - Low Back Sprain Keeping correct posture when sitting, standing or completing your activities will reduce the stress put  on different body tissues, allowing injured tissues a chance to heal and limiting painful experiences. The following are general guidelines for improved posture.  While reading these guidelines, remember: The exercises prescribed by your provider will help you have the flexibility and strength to maintain correct postures. The correct posture provides the best environment for your joints to work. All of your joints have less wear and tear when properly supported by a spine with good posture. This means you will experience a healthier, less painful body. Correct posture must be practiced with all of your activities, especially prolonged sitting and standing. Correct  posture is as important when doing repetitive low-stress activities (typing) as it is when doing a single heavy-load activity (lifting).  RESTING POSITIONS Consider which positions are most painful for you when choosing a resting position. If you have pain with flexion-based activities (sitting, bending, stooping, squatting), choose a position that allows you to rest in a less flexed posture. You would want to avoid curling into a fetal position on your side. If your pain worsens with extension-based activities (prolonged standing, working overhead), avoid resting in an extended position such as sleeping on your stomach. Most people will find more comfort when they rest with their spine in a more neutral position, neither too rounded nor too arched. Lying on a non-sagging bed on your side with a pillow between your knees, or on your back with a pillow under your knees will often provide some relief. Keep in mind, being in any one position for a prolonged period of time, no matter how correct your posture, can still lead to stiffness.  PROPER SITTING POSTURE In order to minimize stress and discomfort on your spine, you must sit with correct posture. Sitting with good posture should be effortless for a healthy body. Returning to good posture is a  gradual process. Many people can work toward this most comfortably by using various supports until they have the flexibility and strength to maintain this posture on their own. When sitting with proper posture, your ears will fall over your shoulders and your shoulders will fall over your hips. You should use the back of the chair to support your upper back. Your lower back will be in a neutral position, just slightly arched. You may place a small pillow or folded towel at the base of your lower back for  support.  When working at a desk, create an environment that supports good, upright posture. Without extra support, muscles tire, which leads to excessive strain on joints and other tissues. Keep these recommendations in mind:  CHAIR: A chair should be able to slide under your desk when your back makes contact with the back of the chair. This allows you to work closely. The chair's height should allow your eyes to be level with the upper part of your monitor and your hands to be slightly lower than your elbows.  BODY POSITION Your feet should make contact with the floor. If this is not possible, use a foot rest. Keep your ears over your shoulders. This will reduce stress on your neck and low back.  INCORRECT SITTING POSTURES  If you are feeling tired and unable to assume a healthy sitting posture, do not slouch or slump. This puts excessive strain on your back tissues, causing more damage and pain. Healthier options include: Using more support, like a lumbar pillow. Switching tasks to something that requires you to be upright or walking. Talking a brief walk. Lying down to rest in a neutral-spine position.  PROLONGED STANDING WHILE SLIGHTLY LEANING FORWARD  When completing a task that requires you to lean forward while standing in one place for a long time, place either foot up on a stationary 2-4 inch high object to help maintain the best posture. When both feet are on the ground, the lower  back tends to lose its slight inward curve. If this curve flattens (or becomes too large), then the back and your other joints will experience too much stress, tire more quickly, and can cause pain.  CORRECT STANDING POSTURES Proper standing posture should be assumed with all  daily activities, even if they only take a few moments, like when brushing your teeth. As in sitting, your ears should fall over your shoulders and your shoulders should fall over your hips. You should keep a slight tension in your abdominal muscles to brace your spine. Your tailbone should point down to the ground, not behind your body, resulting in an over-extended swayback posture.   INCORRECT STANDING POSTURES  Common incorrect standing postures include a forward head, locked knees and/or an excessive swayback. WALKING Walk with an upright posture. Your ears, shoulders and hips should all line-up.  PROLONGED ACTIVITY IN A FLEXED POSITION When completing a task that requires you to bend forward at your waist or lean over a low surface, try to find a way to stabilize 3 out of 4 of your limbs. You can place a hand or elbow on your thigh or rest a knee on the surface you are reaching across. This will provide you more stability, so that your muscles do not tire as quickly. By keeping your knees relaxed, or slightly bent, you will also reduce stress across your lower back. CORRECT LIFTING TECHNIQUES  DO : Assume a wide stance. This will provide you more stability and the opportunity to get as close as possible to the object which you are lifting. Tense your abdominals to brace your spine. Bend at the knees and hips. Keeping your back locked in a neutral-spine position, lift using your leg muscles. Lift with your legs, keeping your back straight. Test the weight of unknown objects before attempting to lift them. Try to keep your elbows locked down at your sides in order get the best strength from your shoulders when carrying an  object.   Always ask for help when lifting heavy or awkward objects. INCORRECT LIFTING TECHNIQUES DO NOT:  Lock your knees when lifting, even if it is a small object. Bend and twist. Pivot at your feet or move your feet when needing to change directions. Assume that you can safely pick up even a paperclip without proper posture.

## 2018-10-15 NOTE — ED Triage Notes (Signed)
Per pt was dx with kidney disease 6 months ago but for 4 days nows pt has been having left flank pain and right but more on left. Pt said no blood in urine but frequency, urgency, and nausea. No burning with urination. Back pain. No fevers no chills

## 2018-10-16 LAB — URINE CULTURE

## 2018-11-16 ENCOUNTER — Other Ambulatory Visit: Payer: Self-pay | Admitting: *Deleted

## 2018-11-16 DIAGNOSIS — Z20822 Contact with and (suspected) exposure to covid-19: Secondary | ICD-10-CM

## 2018-11-17 LAB — NOVEL CORONAVIRUS, NAA: SARS-CoV-2, NAA: NOT DETECTED

## 2019-04-19 ENCOUNTER — Other Ambulatory Visit: Payer: Self-pay

## 2019-04-19 DIAGNOSIS — Z20822 Contact with and (suspected) exposure to covid-19: Secondary | ICD-10-CM

## 2019-04-21 LAB — NOVEL CORONAVIRUS, NAA: SARS-CoV-2, NAA: NOT DETECTED

## 2019-08-09 ENCOUNTER — Ambulatory Visit: Payer: 59 | Attending: Internal Medicine

## 2019-08-09 DIAGNOSIS — Z20822 Contact with and (suspected) exposure to covid-19: Secondary | ICD-10-CM

## 2019-08-10 LAB — NOVEL CORONAVIRUS, NAA: SARS-CoV-2, NAA: NOT DETECTED

## 2019-12-10 ENCOUNTER — Other Ambulatory Visit: Payer: Self-pay

## 2019-12-10 ENCOUNTER — Encounter (HOSPITAL_COMMUNITY): Payer: Self-pay | Admitting: Emergency Medicine

## 2019-12-10 ENCOUNTER — Ambulatory Visit (HOSPITAL_COMMUNITY): Payer: Self-pay

## 2019-12-10 ENCOUNTER — Emergency Department (HOSPITAL_COMMUNITY)
Admission: EM | Admit: 2019-12-10 | Discharge: 2019-12-10 | Disposition: A | Discharge status: Home / Self Care | Payer: 59 | Attending: Emergency Medicine | Admitting: Emergency Medicine

## 2019-12-10 ENCOUNTER — Emergency Department (HOSPITAL_COMMUNITY): Payer: 59

## 2019-12-10 DIAGNOSIS — Y999 Unspecified external cause status: Secondary | ICD-10-CM | POA: Diagnosis not present

## 2019-12-10 DIAGNOSIS — S34109A Unspecified injury to unspecified level of lumbar spinal cord, initial encounter: Secondary | ICD-10-CM

## 2019-12-10 DIAGNOSIS — S300XXA Contusion of lower back and pelvis, initial encounter: Secondary | ICD-10-CM | POA: Insufficient documentation

## 2019-12-10 DIAGNOSIS — W010XXA Fall on same level from slipping, tripping and stumbling without subsequent striking against object, initial encounter: Secondary | ICD-10-CM | POA: Diagnosis not present

## 2019-12-10 DIAGNOSIS — S3992XA Unspecified injury of lower back, initial encounter: Secondary | ICD-10-CM | POA: Diagnosis present

## 2019-12-10 DIAGNOSIS — Y9301 Activity, walking, marching and hiking: Secondary | ICD-10-CM | POA: Insufficient documentation

## 2019-12-10 DIAGNOSIS — Z87891 Personal history of nicotine dependence: Secondary | ICD-10-CM | POA: Insufficient documentation

## 2019-12-10 DIAGNOSIS — Y92480 Sidewalk as the place of occurrence of the external cause: Secondary | ICD-10-CM | POA: Diagnosis not present

## 2019-12-10 DIAGNOSIS — M549 Dorsalgia, unspecified: Secondary | ICD-10-CM

## 2019-12-10 LAB — CBC WITH DIFFERENTIAL/PLATELET
Abs Immature Granulocytes: 0.06 10*3/uL (ref 0.00–0.07)
Basophils Absolute: 0.1 10*3/uL (ref 0.0–0.1)
Basophils Relative: 1 %
Eosinophils Absolute: 0.5 10*3/uL (ref 0.0–0.5)
Eosinophils Relative: 8 %
HCT: 38.4 % (ref 36.0–46.0)
Hemoglobin: 12.6 g/dL (ref 12.0–15.0)
Immature Granulocytes: 1 %
Lymphocytes Relative: 31 %
Lymphs Abs: 2.1 10*3/uL (ref 0.7–4.0)
MCH: 29.3 pg (ref 26.0–34.0)
MCHC: 32.8 g/dL (ref 30.0–36.0)
MCV: 89.3 fL (ref 80.0–100.0)
Monocytes Absolute: 0.4 10*3/uL (ref 0.1–1.0)
Monocytes Relative: 6 %
Neutro Abs: 3.7 10*3/uL (ref 1.7–7.7)
Neutrophils Relative %: 53 %
Platelets: 235 10*3/uL (ref 150–400)
RBC: 4.3 MIL/uL (ref 3.87–5.11)
RDW: 13.8 % (ref 11.5–15.5)
WBC: 6.9 10*3/uL (ref 4.0–10.5)
nRBC: 0 % (ref 0.0–0.2)

## 2019-12-10 LAB — COMPREHENSIVE METABOLIC PANEL
ALT: 13 U/L (ref 0–44)
AST: 13 U/L — ABNORMAL LOW (ref 15–41)
Albumin: 4.1 g/dL (ref 3.5–5.0)
Alkaline Phosphatase: 47 U/L (ref 38–126)
Anion gap: 8 (ref 5–15)
BUN: 11 mg/dL (ref 6–20)
CO2: 19 mmol/L — ABNORMAL LOW (ref 22–32)
Calcium: 8.7 mg/dL — ABNORMAL LOW (ref 8.9–10.3)
Chloride: 113 mmol/L — ABNORMAL HIGH (ref 98–111)
Creatinine, Ser: 0.82 mg/dL (ref 0.44–1.00)
GFR calc Af Amer: >60 mL/min (ref >60)
GFR calc non Af Amer: >60 mL/min (ref >60)
Glucose, Bld: 94 mg/dL (ref 70–99)
Potassium: 3.6 mmol/L (ref 3.5–5.1)
Sodium: 140 mmol/L (ref 135–145)
Total Bilirubin: 0.5 mg/dL (ref 0.3–1.2)
Total Protein: 6.5 g/dL (ref 6.5–8.1)

## 2019-12-10 IMAGING — DX DG CHEST 1V PORT
1 series · 1 of 1 positions shown · non-contrast
Comparison: None

CLINICAL DATA: Fall with thoracic pain

EXAM:
PORTABLE CHEST 1 VIEW

[chest ap]
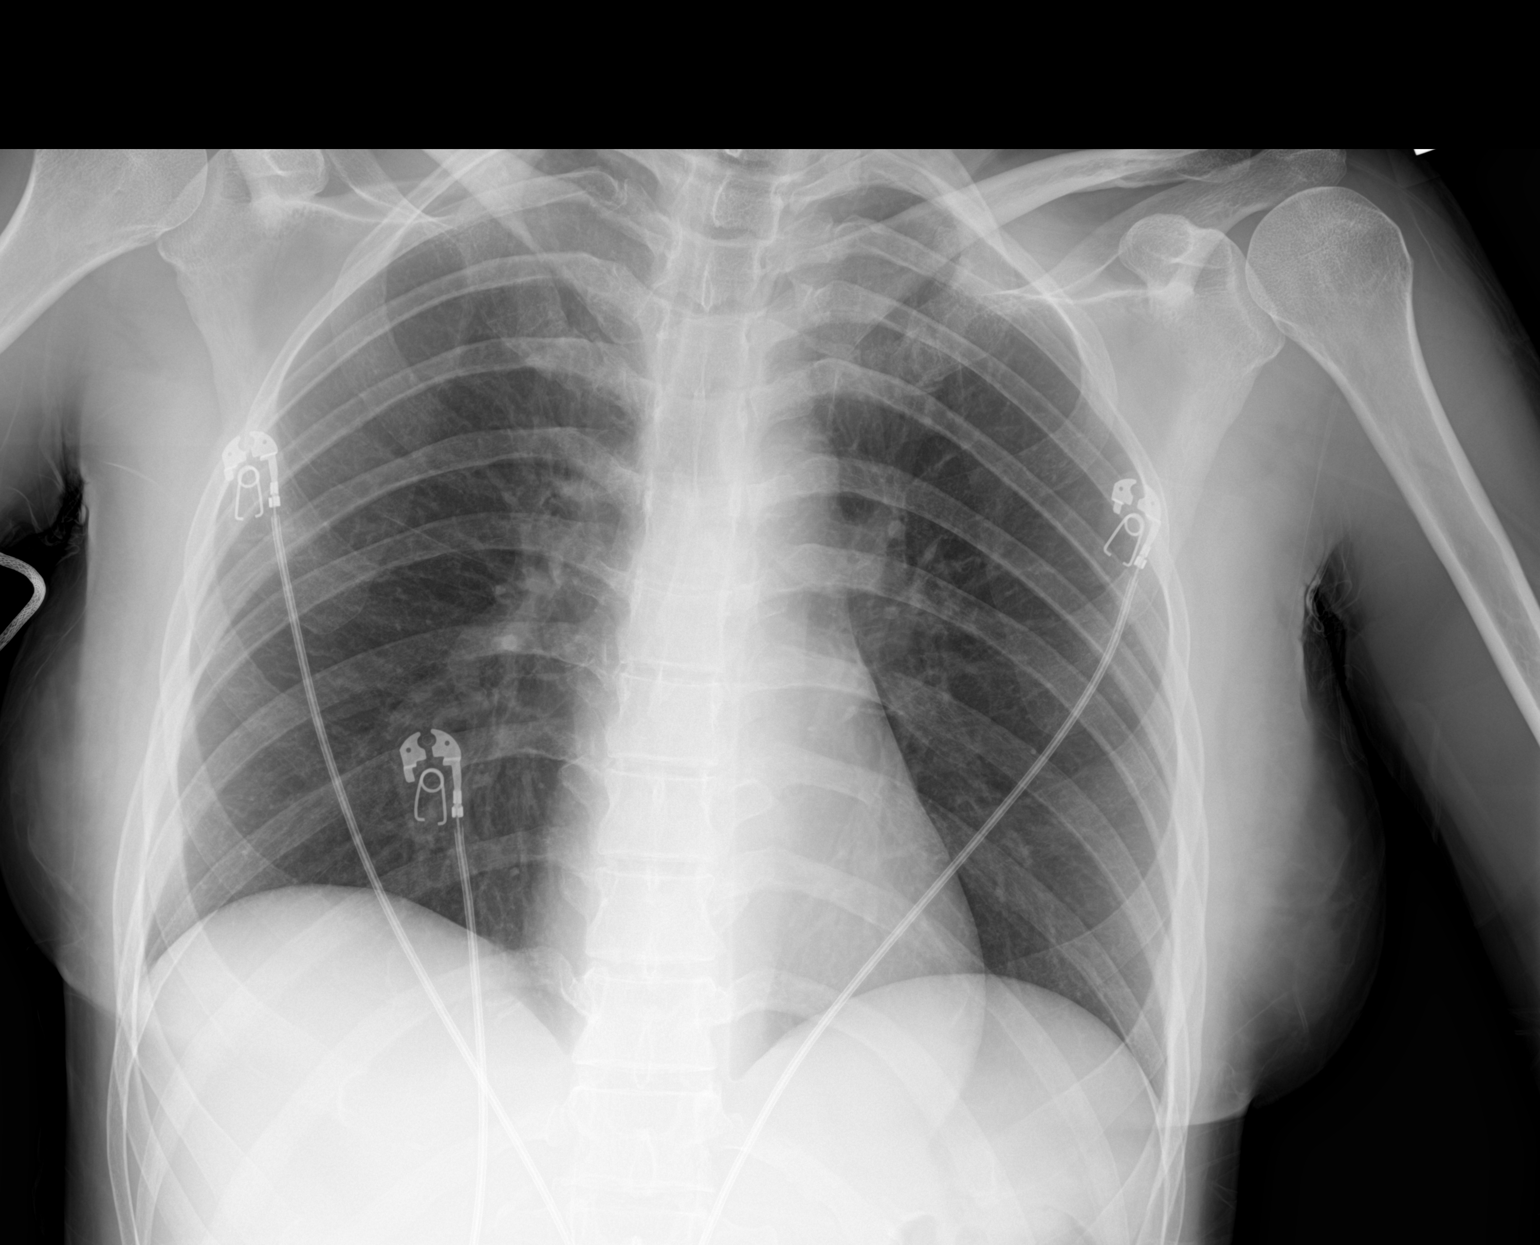

[1 of 1 positions shown; findings below may reference images not displayed]

FINDINGS: Trachea is midline. Cardiomediastinal contours and hilar structures
are normal.

Lungs are clear. No sign of effusion. No visible pneumothorax on
supine radiography.

On limited assessment skeletal structures without acute process.
IMPRESSION: No acute cardiopulmonary disease.

## 2019-12-10 IMAGING — CT CT ABD-PELV W/ CM
3 of 9 series · 14 of 36 positions shown, 16 images · IV contrast (omnipaque)
Comparison: Lumbar spine CT dated [DATE].

CLINICAL DATA: 43-year-old female with fall and abdominal pain.

EXAM:
CT CHEST, ABDOMEN, AND PELVIS WITH CONTRAST
TECHNIQUE: Multidetector CT imaging of the chest, abdomen and pelvis was
performed following the standard protocol during bolus
administration of intravenous contrast.
CONTRAST:  100mL OMNIPAQUE IOHEXOL 300 MG/ML  SOLN

[Series 2: cap with · axial · 0.71mm/px · z∈[-586,-96]mm · 8 of 128 slices shown, 10 images]
[im 15/128  mediastinal]
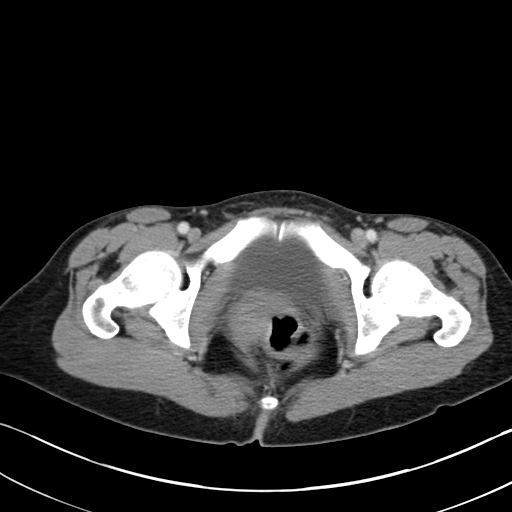
[im 15/128  lung]
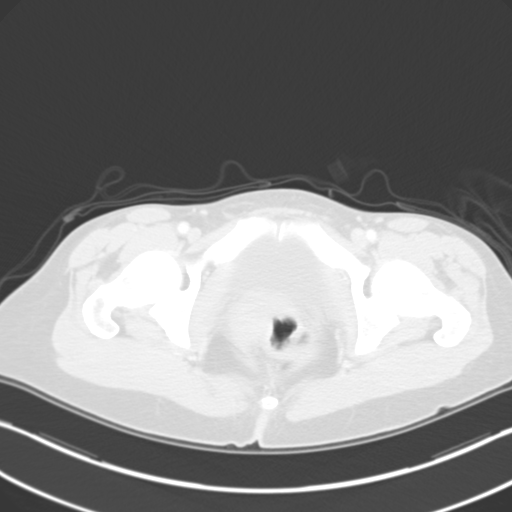
[im 29/128  lung]
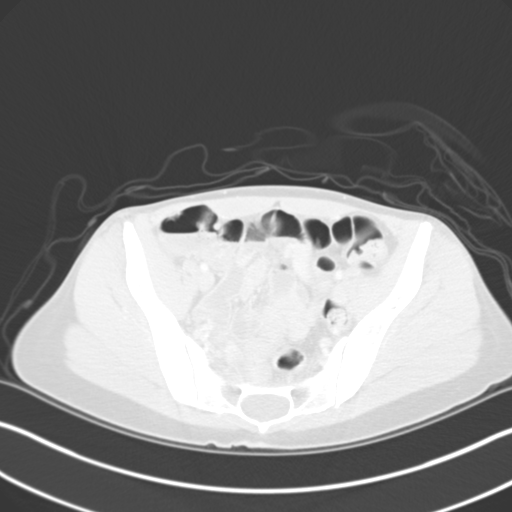
[im 43/128  lung]
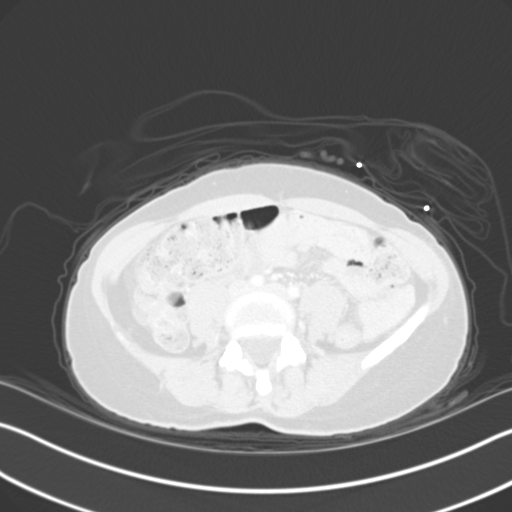
[im 57/128  lung]
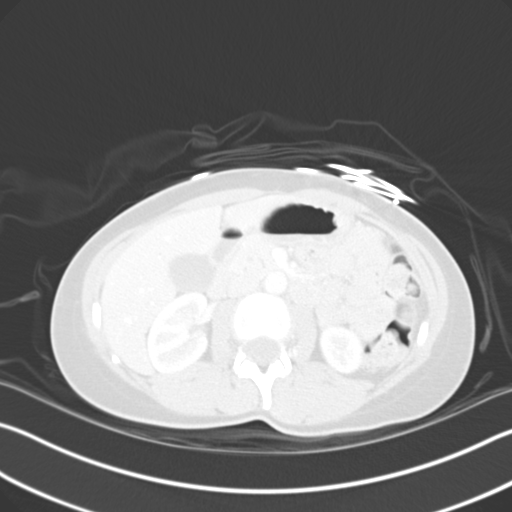
[im 71/128  mediastinal]
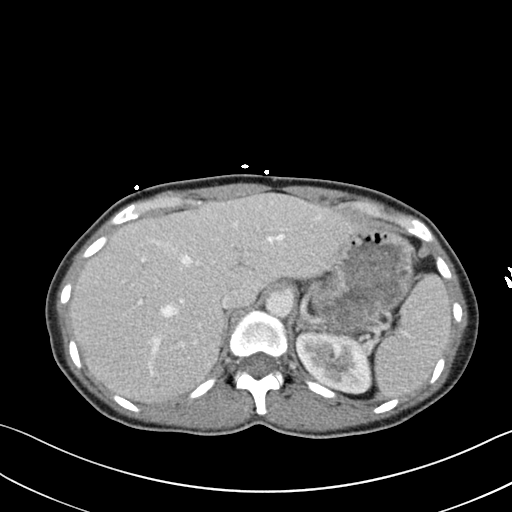
[im 71/128  lung]
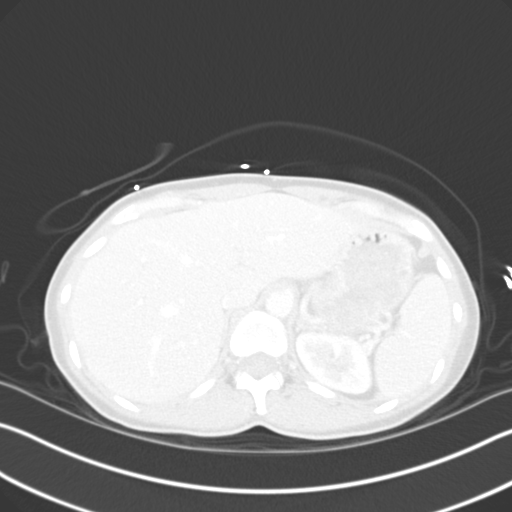
[im 85/128  lung]
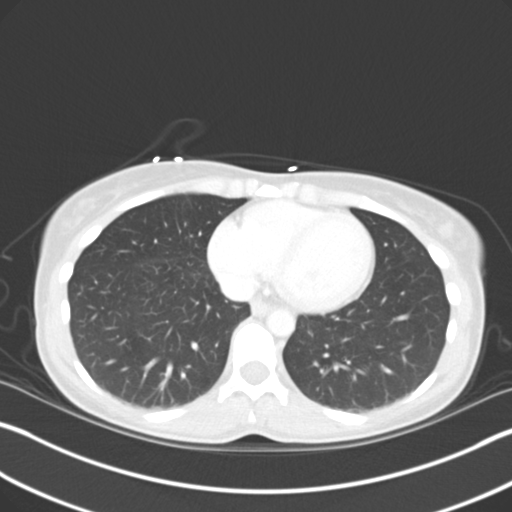
[im 99/128  lung]
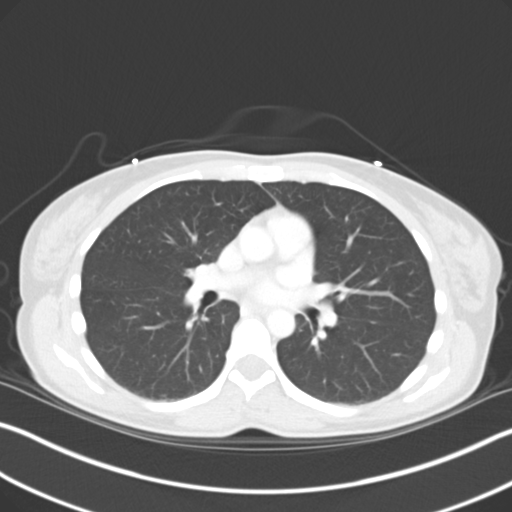
[im 113/128  lung]
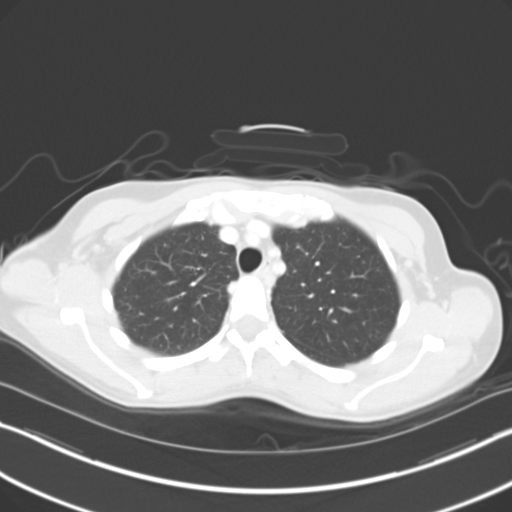

[Series 3: lung · axial · 0.71mm/px · z∈[-277,-213]mm · 3 of 145 slices shown]
[im 17/145  lung]
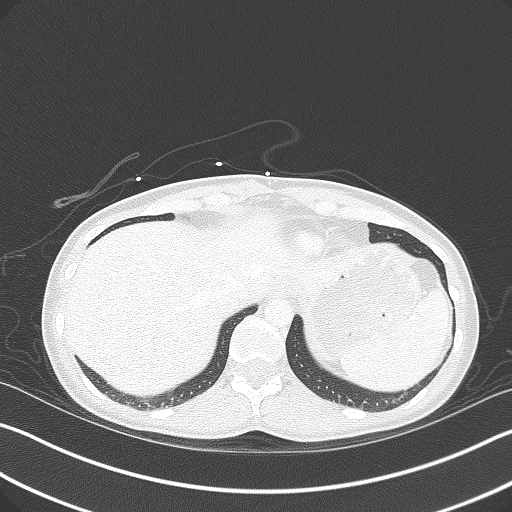
[im 33/145  lung]
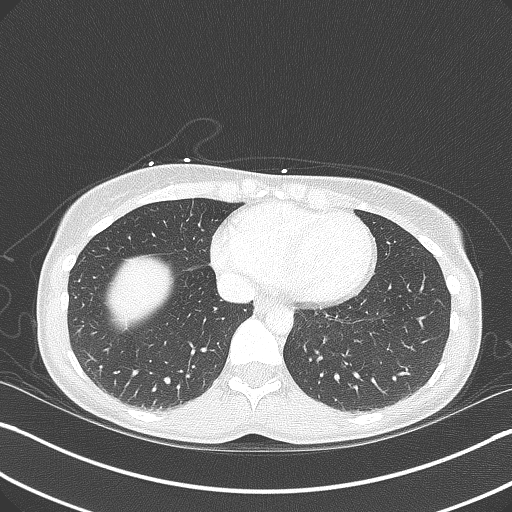
[im 49/145  lung]
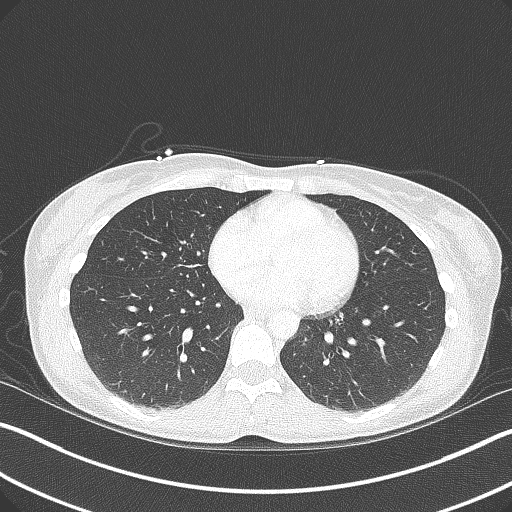

[Series 5: coronals · coronal · 0.71mm/px · 3 of 99 slices shown]
[im 25/99  lung]
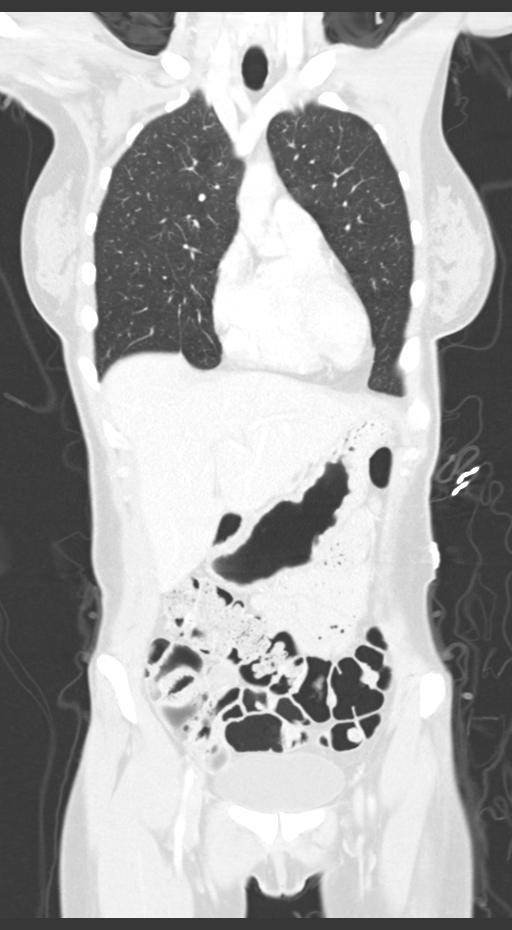
[im 50/99  lung]
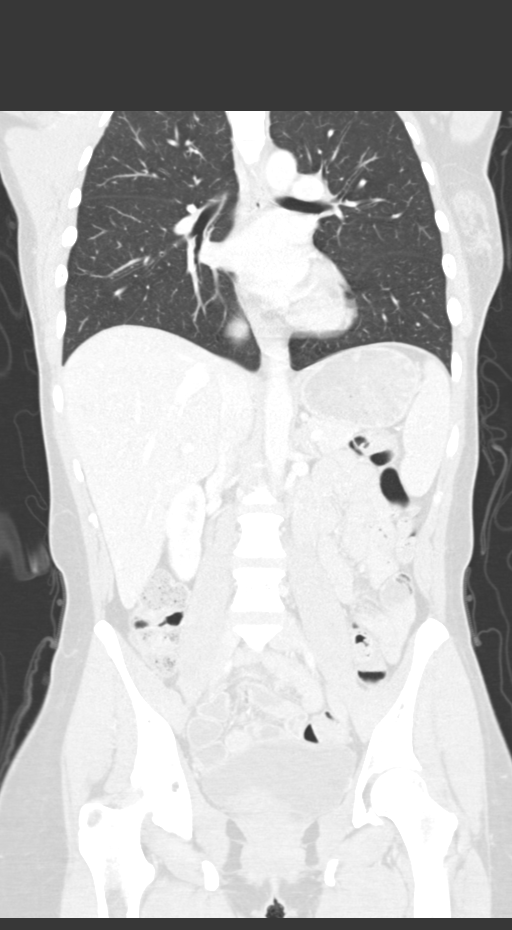
[im 74/99  lung]
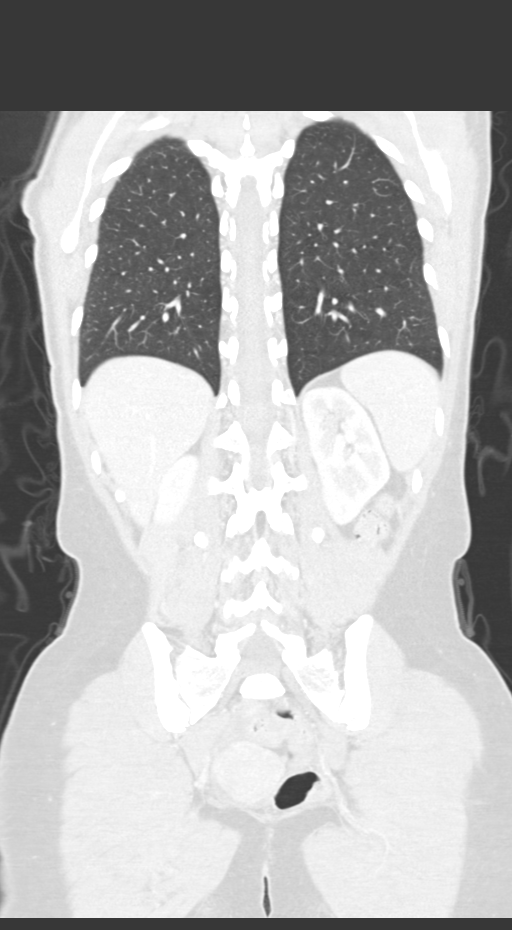

[14 of 36 positions shown; findings below may reference images not displayed]

FINDINGS: CT CHEST FINDINGS

Cardiovascular: There is no cardiomegaly or pericardial effusion.
The thoracic aorta is unremarkable. The origins of the great vessels
of the aortic arch appear patent as visualized. The central
pulmonary arteries appear patent.

Mediastinum/Nodes: No hilar or mediastinal adenopathy. Ingested
content noted in the upper esophagus which may represent reflux or
incomplete esophageal clearance. The thyroid gland is unremarkable
as visualized. No mediastinal fluid collection or hematoma.

Lungs/Pleura: The lungs are clear. There is no pleural effusion or
pneumothorax. The central airways are patent.

Musculoskeletal: No chest wall mass or suspicious bone lesions
identified.

CT ABDOMEN PELVIS FINDINGS

No intra-abdominal free air. Probable trace free fluid within the
pelvis, likely physiologic.

Hepatobiliary: No focal liver abnormality is seen. No gallstones,
gallbladder wall thickening, or biliary dilatation.

Pancreas: Unremarkable. No pancreatic ductal dilatation or
surrounding inflammatory changes.

Spleen: Normal in size without focal abnormality.

Adrenals/Urinary Tract: Adrenal glands are unremarkable. Kidneys are
normal, without renal calculi, focal lesion, or hydronephrosis.
Bladder is unremarkable.

Stomach/Bowel: There is moderate stool throughout the colon. There
is no bowel obstruction or active inflammation. The appendix is
normal.

Vascular/Lymphatic: The abdominal aorta and IVC are unremarkable. No
portal venous gas. There is no adenopathy.

Reproductive: The uterus is retroverted and grossly unremarkable.
Trace fluid may be present in the fundal endometrium. There is a 12
mm dominant follicle or corpus luteum in the right ovary.

Other: None

Musculoskeletal: No acute or significant osseous findings. There is
mild expansion of the sacral neural foramina most consistent with
Tarlov cysts.
IMPRESSION: No acute/traumatic intrathoracic, abdominal, or pelvic pathology.

## 2019-12-10 MED ORDER — IBUPROFEN 600 MG PO TABS
600.0000 mg | ORAL_TABLET | Freq: Three times a day (TID) | ORAL | 0 refills | Status: DC
Start: 2019-12-10 — End: 2021-02-20

## 2019-12-10 MED ORDER — METHOCARBAMOL 500 MG PO TABS
500.0000 mg | ORAL_TABLET | Freq: Two times a day (BID) | ORAL | 0 refills | Status: DC
Start: 2019-12-10 — End: 2021-02-20

## 2019-12-10 MED ORDER — ACETAMINOPHEN ER 650 MG PO TBCR: 650.0000 mg | EXTENDED_RELEASE_TABLET | Freq: Four times a day (QID) | ORAL | 0 refills | Status: DC

## 2019-12-10 MED ORDER — IOHEXOL 300 MG/ML  SOLN
100.0000 mL | Freq: Once | INTRAMUSCULAR | Status: AC | PRN
  Administered 2019-12-10: 100 mL via INTRAVENOUS

## 2019-12-10 MED ORDER — KETOROLAC TROMETHAMINE 30 MG/ML IJ SOLN
15.0000 mg | Freq: Once | INTRAMUSCULAR | Status: AC
  Administered 2019-12-10: 15 mg via INTRAVENOUS
  Filled 2019-12-10: qty 1

## 2019-12-10 MED ORDER — SODIUM CHLORIDE 0.9 % IV BOLUS
500.0000 mL | Freq: Once | INTRAVENOUS | Status: AC
  Administered 2019-12-10: 500 mL via INTRAVENOUS

## 2019-12-10 MED ORDER — OXYCODONE-ACETAMINOPHEN 5-325 MG PO TABS
1.0000 | ORAL_TABLET | Freq: Once | ORAL | Status: AC
  Administered 2019-12-10: 1 via ORAL
  Filled 2019-12-10: qty 1

## 2019-12-10 MED ORDER — HYDROCODONE-ACETAMINOPHEN 5-325 MG PO TABS: 1.0000 | ORAL_TABLET | Freq: Three times a day (TID) | ORAL | 0 refills | Status: DC

## 2019-12-10 MED ORDER — FENTANYL CITRATE (PF) 100 MCG/2ML IJ SOLN
50.0000 ug | Freq: Once | INTRAMUSCULAR | Status: AC
  Administered 2019-12-10: 50 ug via INTRAVENOUS
  Filled 2019-12-10: qty 2

## 2019-12-10 MED ORDER — SODIUM CHLORIDE (PF) 0.9 % IJ SOLN
INTRAMUSCULAR | Status: AC
  Filled 2019-12-10: qty 50

## 2019-12-10 MED ORDER — HYDROMORPHONE HCL 1 MG/ML IJ SOLN
1.0000 mg | Freq: Once | INTRAMUSCULAR | Status: AC
  Administered 2019-12-10: 1 mg via INTRAVENOUS
  Filled 2019-12-10: qty 1

## 2019-12-10 NOTE — ED Notes (Signed)
Dark green also collected, is in mini lab if Istat BHCG is ordered

## 2019-12-10 NOTE — Discharge Instructions (Signed)
We saw in the ER for back pain after you had a fall. Fortunately, our imaging does not reveal any spine fractures or internal injuries.  You likely have contusion/deep bruise.  We anticipate that the pain might get worse over the next 24 to 48 hours.  We recommend that you take the medications prescribed for the next 3 days along with ice therapy for the first 24 hours.  You can alternate between ice and heat thereafter.  Activity should be continued as tolerated. Consider seeing your primary care doctor if the pain doesn't start improving.

## 2019-12-10 NOTE — ED Triage Notes (Signed)
Patient arrived by EMS from home. Patient c/o severe lower back pain. Patient fell backwards yesterday from curb to the road.   Patient denies LOC.   Patient has extreme pain with walking. Patient is now unable to walk.   Patient attempted to NSAID w/ no relief of pain.

## 2019-12-11 NOTE — ED Provider Notes (Signed)
[facility] DEPT Provider Note   CSN: [id number] Arrival date & time: 12/10/19  1250     History Chief Complaint  Patient presents with  . Fall    Vanessa Reese is a 44 y.o. female.  HPI    44 year old female comes in with chief complaint of fall.  She reports that she was walking outside, tripped over a curb and fell backwards onto the edge of the curb.  The edge of the curb had direct impact with her lower back.  She has been having severe lower back pain, gluteal pain.  The pain starts at the level of epigastric abdomen/chest region and down.  She denies any associated numbness, tingling.  She denies head trauma, loss of consciousness, confusion seizures.  She is not on any blood thinners.  No major medical history.  Past Medical History:  Diagnosis Date  . Bilateral ovarian cysts   . Migraine     There are no problems to display for this patient.   Past Surgical History:  Procedure Laterality Date  . OVARIAN CYST REMOVAL    . TONSILLECTOMY       OB History   No obstetric history on file.     History reviewed. No pertinent family history.  Social History   Tobacco Use  . Smoking status: Former Research scientist (life sciences)  . Smokeless tobacco: Never Used  Substance Use Topics  . Alcohol use: Yes    Comment: Occasionally  . Drug use: No    Home Medications Prior to Admission medications   Medication Sig Start Date End Date Taking? Authorizing Provider  ALPRAZolam Duanne Moron) 1 MG tablet Take 1 mg by mouth 3 (three) times daily as needed for anxiety.  09/12/19  Yes [provider]  buPROPion (WELLBUTRIN XL) 150 MG 24 hr tablet Take 150 mg by mouth every morning. 09/12/19  Yes [provider]  buPROPion (WELLBUTRIN XL) 300 MG 24 hr tablet Take 300 mg by mouth in the morning.  09/12/19  Yes [provider]  cyclobenzaprine (FLEXERIL) 10 MG tablet Take 5-10 mg by mouth 3 (three) times daily as needed for muscle spasms.  12/10/19  Yes  [provider]  gabapentin (NEURONTIN) 600 MG tablet Take 600 mg by mouth at bedtime.  10/25/19  Yes [provider]  meloxicam (MOBIC) 7.5 MG tablet Take 7.5 mg by mouth 2 (two) times daily as needed for pain.  12/10/19  Yes [provider]  sertraline (ZOLOFT) 100 MG tablet Take 100 mg by mouth daily.  09/12/19  Yes [provider]  topiramate (TOPAMAX) 100 MG tablet Take 100 mg by mouth daily. 10/25/19  Yes [provider]  acetaminophen (TYLENOL 8 HOUR) 650 MG CR tablet Take 1 tablet (650 mg total) by mouth every 6 (six) hours. 12/10/19   Varney Biles, MD  cyclobenzaprine (FLEXERIL) 5 MG tablet Take 1 tablet (5 mg total) by mouth 3 (three) times daily as needed for muscle spasms. Patient not taking: Reported on 12/10/2019 10/15/18   Shelda Pal, DO  HYDROcodone-acetaminophen (NORCO/VICODIN) 5-325 MG tablet Take 1 tablet by mouth every 8 (eight) hours. 12/10/19   Varney Biles, MD  ibuprofen (ADVIL) 600 MG tablet Take 1 tablet (600 mg total) by mouth 3 (three) times daily. 12/10/19   Varney Biles, MD  methocarbamol (ROBAXIN) 500 MG tablet Take 1 tablet (500 mg total) by mouth 2 (two) times daily. 12/10/19   Varney Biles, MD    Allergies    Imitrex [sumatriptan]  Review of Systems   Review of Systems  Constitutional: Positive for activity change.  Respiratory: Negative for shortness of breath.   Cardiovascular: Negative for chest pain.  Gastrointestinal: Negative for nausea and vomiting.  Musculoskeletal: Positive for arthralgias and back pain. Negative for neck pain.  Neurological: Negative for headaches.  Hematological: Does not bruise/bleed easily.  All other systems reviewed and are negative.   Physical Exam Updated Vital Signs BP 105/72   Pulse 60   Temp 98 F (36.7 C) (Oral)   Resp 19   Ht 5\' 3"  (1.6 m)   Wt 54.4 kg   LMP 11/26/2019   SpO2 100%   BMI 21.26 kg/m   Physical Exam Vitals and nursing note reviewed.   Constitutional:      General: She is in acute distress.     Appearance: She is well-developed.     Comments: Distress secondary to pain  HENT:     Head: Normocephalic and atraumatic.  Eyes:     Pupils: Pupils are equal, round, and reactive to light.  Neck:     Comments: Patient has paraspinal tenderness.  Able to turn head to 45 degrees bilaterally without any pain and able to flex neck to the chest and extend without any pain or neurologic symptoms.  Cardiovascular:     Rate and Rhythm: Normal rate.  Pulmonary:     Effort: Pulmonary effort is normal.  Abdominal:     General: Bowel sounds are normal.  Musculoskeletal:     Cervical back: Normal range of motion and neck supple.     Comments: Patient has significant tenderness over the starting at about T10 level and below. Tenderness appears to be both midline and also left-sided paraspinal.  No hematoma or ecchymosis appreciated.  There is some discomfort in the lower well.  Skin:    General: Skin is warm and dry.  Neurological:     Mental Status: She is alert and oriented to person, place, and time.     ED Results / Procedures / Treatments   Labs (all labs ordered are listed, but only abnormal results are displayed) Labs Reviewed  COMPREHENSIVE METABOLIC PANEL - Abnormal; Notable for the following components:      Result Value   Chloride 113 (*)    CO2 19 (*)    Calcium 8.7 (*)    AST 13 (*)    All other components within normal limits  CBC WITH DIFFERENTIAL/PLATELET    EKG None  Radiology CT Chest W Contrast  Result Date: 12/10/2019 CLINICAL DATA:  44 year old female with fall and abdominal pain. EXAM: CT CHEST, ABDOMEN, AND PELVIS WITH CONTRAST TECHNIQUE: Multidetector CT imaging of the chest, abdomen and pelvis was performed following the standard protocol during bolus administration of intravenous contrast. CONTRAST:  166mL OMNIPAQUE IOHEXOL 300 MG/ML  SOLN COMPARISON:  Lumbar spine CT dated 12/10/2019.  FINDINGS: CT CHEST FINDINGS Cardiovascular: There is no cardiomegaly or pericardial effusion. The thoracic aorta is unremarkable. The origins of the great vessels of the aortic arch appear patent as visualized. The central pulmonary arteries appear patent. Mediastinum/Nodes: No hilar or mediastinal adenopathy. Ingested content noted in the upper esophagus which may represent reflux or incomplete esophageal clearance. The thyroid gland is unremarkable as visualized. No mediastinal fluid collection or hematoma. Lungs/Pleura: The lungs are clear. There is no pleural effusion or pneumothorax. The central airways are patent. Musculoskeletal: No chest wall mass or suspicious bone lesions identified. CT ABDOMEN PELVIS FINDINGS No intra-abdominal free air. Probable trace  free fluid within the pelvis, likely physiologic. Hepatobiliary: No focal liver abnormality is seen. No gallstones, gallbladder wall thickening, or biliary dilatation. Pancreas: Unremarkable. No pancreatic ductal dilatation or surrounding inflammatory changes. Spleen: Normal in size without focal abnormality. Adrenals/Urinary Tract: Adrenal glands are unremarkable. Kidneys are normal, without renal calculi, focal lesion, or hydronephrosis. Bladder is unremarkable. Stomach/Bowel: There is moderate stool throughout the colon. There is no bowel obstruction or active inflammation. The appendix is normal. Vascular/Lymphatic: The abdominal aorta and IVC are unremarkable. No portal venous gas. There is no adenopathy. Reproductive: The uterus is retroverted and grossly unremarkable. Trace fluid may be present in the fundal endometrium. There is a 12 mm dominant follicle or corpus luteum in the right ovary. Other: None Musculoskeletal: No acute or significant osseous findings. There is mild expansion of the sacral neural foramina most consistent with Tarlov cysts. IMPRESSION: No acute/traumatic intrathoracic, abdominal, or pelvic pathology. Electronically Signed    By: Anner Crete M.D.   On: 12/10/2019 16:08   CT ABDOMEN PELVIS W CONTRAST  Result Date: 12/10/2019 CLINICAL DATA:  44 year old female with fall and abdominal pain. EXAM: CT CHEST, ABDOMEN, AND PELVIS WITH CONTRAST TECHNIQUE: Multidetector CT imaging of the chest, abdomen and pelvis was performed following the standard protocol during bolus administration of intravenous contrast. CONTRAST:  161mL OMNIPAQUE IOHEXOL 300 MG/ML  SOLN COMPARISON:  Lumbar spine CT dated 12/10/2019. FINDINGS: CT CHEST FINDINGS Cardiovascular: There is no cardiomegaly or pericardial effusion. The thoracic aorta is unremarkable. The origins of the great vessels of the aortic arch appear patent as visualized. The central pulmonary arteries appear patent. Mediastinum/Nodes: No hilar or mediastinal adenopathy. Ingested content noted in the upper esophagus which may represent reflux or incomplete esophageal clearance. The thyroid gland is unremarkable as visualized. No mediastinal fluid collection or hematoma. Lungs/Pleura: The lungs are clear. There is no pleural effusion or pneumothorax. The central airways are patent. Musculoskeletal: No chest wall mass or suspicious bone lesions identified. CT ABDOMEN PELVIS FINDINGS No intra-abdominal free air. Probable trace free fluid within the pelvis, likely physiologic. Hepatobiliary: No focal liver abnormality is seen. No gallstones, gallbladder wall thickening, or biliary dilatation. Pancreas: Unremarkable. No pancreatic ductal dilatation or surrounding inflammatory changes. Spleen: Normal in size without focal abnormality. Adrenals/Urinary Tract: Adrenal glands are unremarkable. Kidneys are normal, without renal calculi, focal lesion, or hydronephrosis. Bladder is unremarkable. Stomach/Bowel: There is moderate stool throughout the colon. There is no bowel obstruction or active inflammation. The appendix is normal. Vascular/Lymphatic: The abdominal aorta and IVC are unremarkable. No portal  venous gas. There is no adenopathy. Reproductive: The uterus is retroverted and grossly unremarkable. Trace fluid may be present in the fundal endometrium. There is a 12 mm dominant follicle or corpus luteum in the right ovary. Other: None Musculoskeletal: No acute or significant osseous findings. There is mild expansion of the sacral neural foramina most consistent with Tarlov cysts. IMPRESSION: No acute/traumatic intrathoracic, abdominal, or pelvic pathology. Electronically Signed   By: Anner Crete M.D.   On: 12/10/2019 16:08   CT L-SPINE NO CHARGE  Result Date: 12/10/2019 CLINICAL DATA:  44 year old female with fall and back pain. EXAM: CT LUMBAR SPINE WITHOUT CONTRAST TECHNIQUE: Multidetector CT imaging of the lumbar spine was performed without intravenous contrast administration. Multiplanar CT image reconstructions were also generated. COMPARISON:  CT of the chest abdomen pelvis dated 12/10/2019. FINDINGS: Segmentation: 5 lumbar type vertebrae. Alignment: Normal. Vertebrae: No acute fracture or focal pathologic process. Paraspinal and other soft tissues: Negative. Disc levels: No acute  findings. No significant degenerative changes. IMPRESSION: No acute/traumatic lumbar spine pathology. Electronically Signed   By: Anner Crete M.D.   On: 12/10/2019 16:09   DG Chest Port 1 View  Result Date: 12/10/2019 CLINICAL DATA:  Fall with thoracic pain EXAM: PORTABLE CHEST 1 VIEW COMPARISON:  None FINDINGS: Trachea is midline. Cardiomediastinal contours and hilar structures are normal. Lungs are clear. No sign of effusion. No visible pneumothorax on supine radiography. On limited assessment skeletal structures without acute process. IMPRESSION: No acute cardiopulmonary disease. Electronically Signed   By: Zetta Bills M.D.   On: 12/10/2019 14:17    Procedures Procedures (including critical care time)  Medications Ordered in ED Medications  fentaNYL (SUBLIMAZE) injection 50 mcg (50 mcg Intravenous  Given 12/10/19 1417)  sodium chloride 0.9 % bolus 500 mL (0 mLs Intravenous Stopped 12/10/19 1553)  HYDROmorphone (DILAUDID) injection 1 mg (1 mg Intravenous Given 12/10/19 1447)  iohexol (OMNIPAQUE) 300 MG/ML solution 100 mL (100 mLs Intravenous Contrast Given 12/10/19 1528)  ketorolac (TORADOL) 30 MG/ML injection 15 mg (15 mg Intravenous Given 12/10/19 1657)  oxyCODONE-acetaminophen (PERCOCET/ROXICET) 5-325 MG per tablet 1 tablet (1 tablet Oral Given 12/10/19 1657)    ED Course  I have reviewed the triage vital signs and the nursing notes.  Pertinent labs & imaging results that were available during my care of the patient were reviewed by me and considered in my medical decision making (see chart for details).    MDM Rules/Calculators/A&P                          44 year old comes in a chief complaint of fall. She has no major medical problems and not on any blood thinners.  The fall led to injury to her spine.  She appears to be in distress on arrival.  Pain control initiated.  C-collar was placed by our staff, but I feel comfortable clearing her C-spine and brain clinically.  CT scan of the chest abdomen pelvis ordered to ensure there is no internal bleeding no fractures, rib fractures.   Trauma work-up in the ER is reassuring. Results discussed with the patient and her family.  Stable for discharge.  Final Clinical Impression(s) / ED Diagnoses Final diagnoses:  Back pain  Injury of lumbar spine, initial encounter ([place])  Contusion of lower back, initial encounter    Rx / DC Orders ED Discharge Orders         Ordered    ibuprofen (ADVIL) 600 MG tablet  3 times daily     Discontinue  Reprint     12/10/19 1739    acetaminophen (TYLENOL 8 HOUR) 650 MG CR tablet  Every 6 hours     Discontinue  Reprint     12/10/19 1739    methocarbamol (ROBAXIN) 500 MG tablet  2 times daily     Discontinue  Reprint     12/10/19 1739    HYDROcodone-acetaminophen (NORCO/VICODIN) 5-325 MG tablet  Every 8  hours     Discontinue  Reprint     12/10/19 Braman, Zameer Borman, MD 12/11/19 1903

## 2019-12-29 ENCOUNTER — Emergency Department (HOSPITAL_COMMUNITY): Payer: 59

## 2019-12-29 ENCOUNTER — Emergency Department (HOSPITAL_COMMUNITY)
Admission: EM | Admit: 2019-12-29 | Discharge: 2019-12-29 | Disposition: A | Discharge status: Home / Self Care | Payer: 59 | Attending: Emergency Medicine | Admitting: Emergency Medicine

## 2019-12-29 ENCOUNTER — Encounter (HOSPITAL_COMMUNITY): Payer: Self-pay

## 2019-12-29 ENCOUNTER — Other Ambulatory Visit: Payer: Self-pay

## 2019-12-29 DIAGNOSIS — Y999 Unspecified external cause status: Secondary | ICD-10-CM | POA: Diagnosis not present

## 2019-12-29 DIAGNOSIS — W1830XA Fall on same level, unspecified, initial encounter: Secondary | ICD-10-CM | POA: Insufficient documentation

## 2019-12-29 DIAGNOSIS — S300XXA Contusion of lower back and pelvis, initial encounter: Secondary | ICD-10-CM | POA: Diagnosis not present

## 2019-12-29 DIAGNOSIS — M533 Sacrococcygeal disorders, not elsewhere classified: Secondary | ICD-10-CM | POA: Diagnosis present

## 2019-12-29 DIAGNOSIS — Y92007 Garden or yard of unspecified non-institutional (private) residence as the place of occurrence of the external cause: Secondary | ICD-10-CM | POA: Insufficient documentation

## 2019-12-29 DIAGNOSIS — S3210XA Unspecified fracture of sacrum, initial encounter for closed fracture: Secondary | ICD-10-CM | POA: Diagnosis not present

## 2019-12-29 DIAGNOSIS — W19XXXA Unspecified fall, initial encounter: Secondary | ICD-10-CM

## 2019-12-29 DIAGNOSIS — Z87891 Personal history of nicotine dependence: Secondary | ICD-10-CM | POA: Insufficient documentation

## 2019-12-29 DIAGNOSIS — Y9302 Activity, running: Secondary | ICD-10-CM | POA: Insufficient documentation

## 2019-12-29 IMAGING — MR MR LUMBAR SPINE W/O CM
5 series · 32 of 48 positions shown · non-contrast
Comparison: CT of the lumbar spine [DATE].

CLINICAL DATA: Low back pain, progressive neurologic deficit.
Additional history provided: Patient reports back pain and right leg
pain, fall 3 weeks ago with "fractured tailbone." Fall today.
Patient unable to ambulate.

EXAM:
MRI LUMBAR SPINE WITHOUT CONTRAST
TECHNIQUE: Multiplanar, multisequence MR imaging of the lumbar spine was
performed. No intravenous contrast was administered.

[Series 5: T1 · sagittal · 4.0mm · 0.72mm/px · 6 of 15 slices shown (1 of 2)]
[im 1/15]
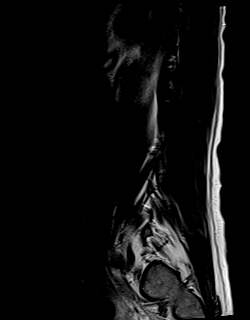
[im 3/15]
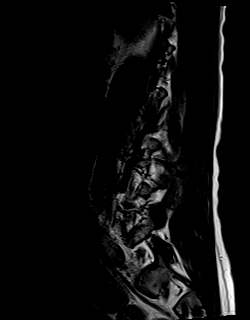
[im 6/15]
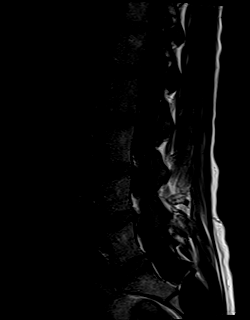
[im 9/15]
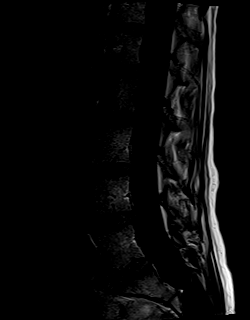
[im 12/15]
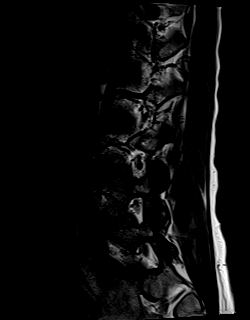
[im 15/15]
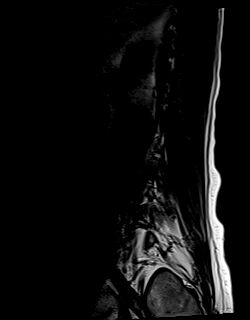

[Series 6: T2 · sagittal · 4.0mm · 0.72mm/px · 6 of 15 slices shown (1 of 2)]
[im 1/15]
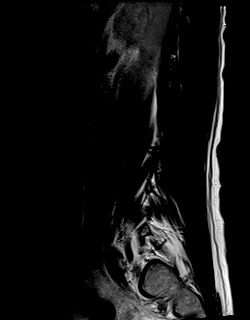
[im 3/15]
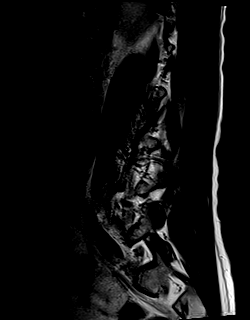
[im 6/15]
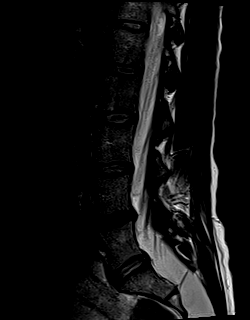
[im 9/15]
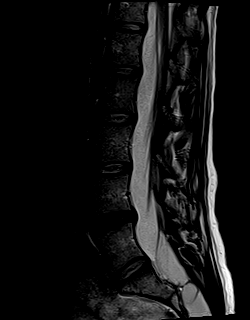
[im 12/15]
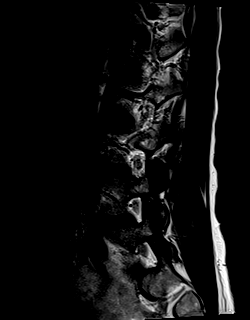
[im 15/15]
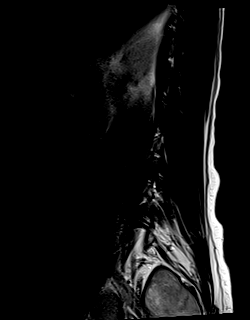

[Series 7: STIR · sagittal · 4.0mm · 0.45mm/px · 2 of 15 slices shown]
[im 1/15]
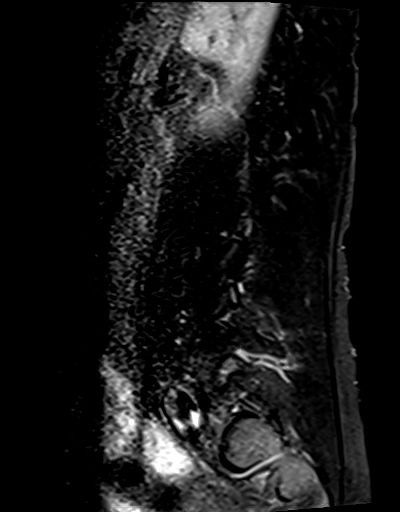
[im 3/15]
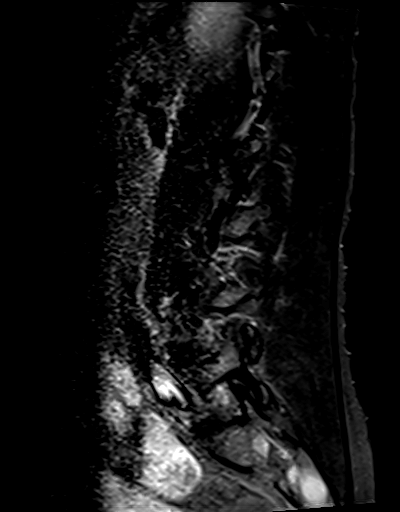

[Series 8: T2 · axial · 4.0mm · 0.62mm/px · z∈[-101,+108]mm · 9 of 39 slices shown (2 of 2)]
[im 1/39]
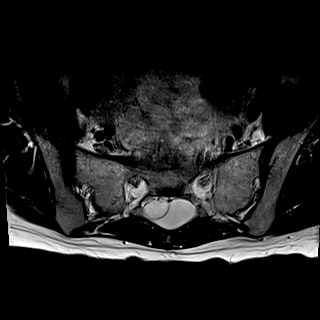
[im 6/39]
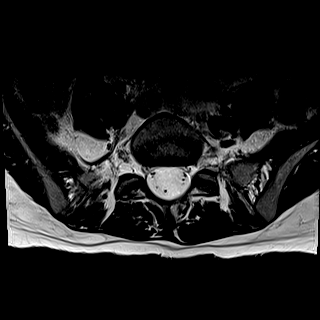
[im 11/39]
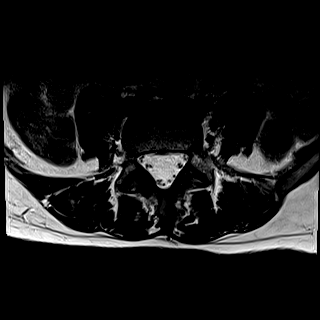
[im 17/39]
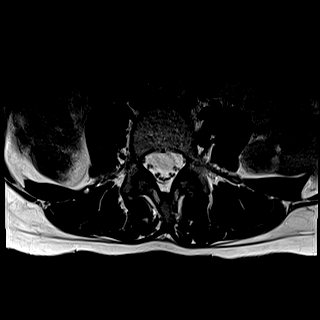
[im 20/39]
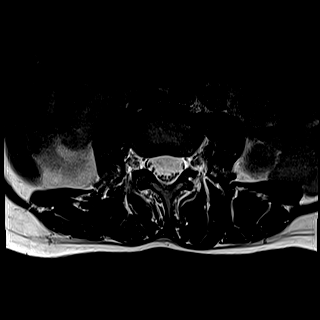
[im 22/39]
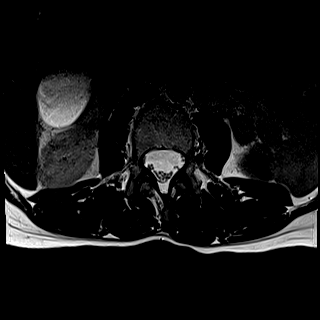
[im 28/39]
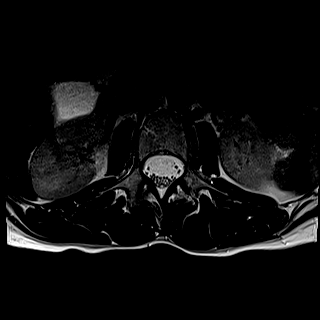
[im 33/39]
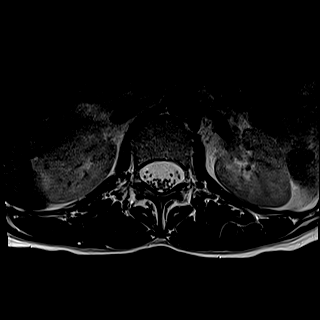
[im 39/39]
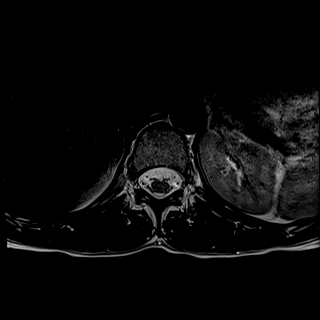

[Series 9: T1 · axial · 4.0mm · 0.39mm/px · z∈[-101,+108]mm · 9 of 39 slices shown (2 of 2)]
[im 1/39]
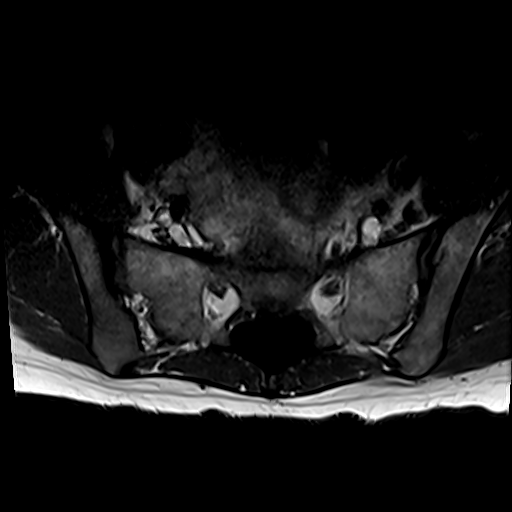
[im 6/39]
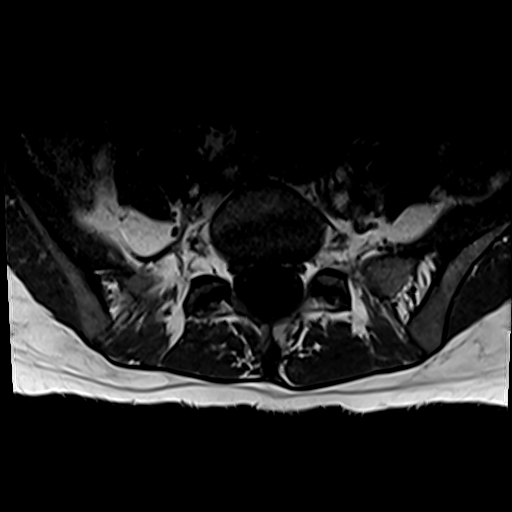
[im 11/39]
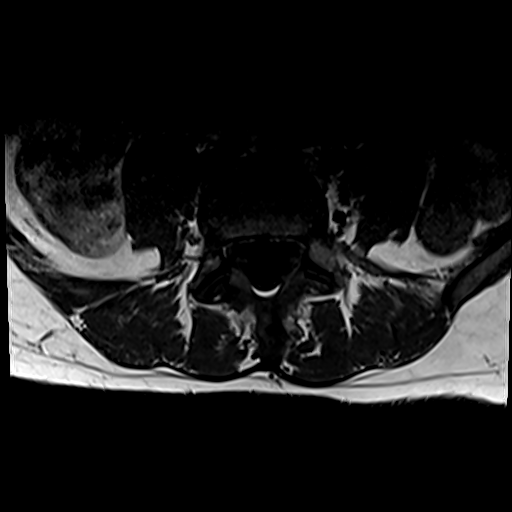
[im 17/39]
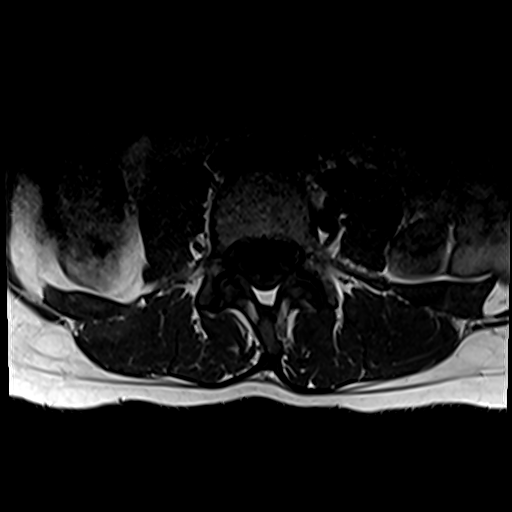
[im 20/39]
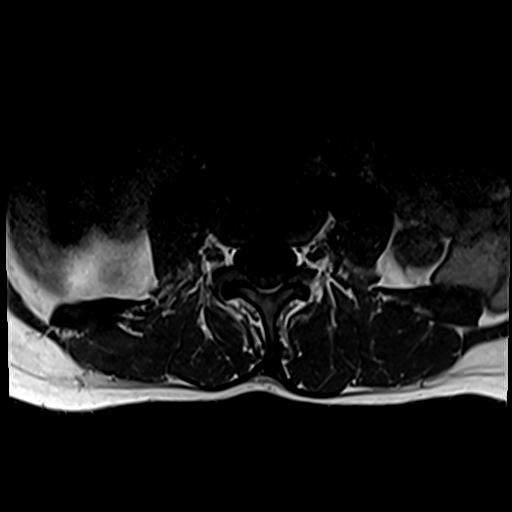
[im 22/39]
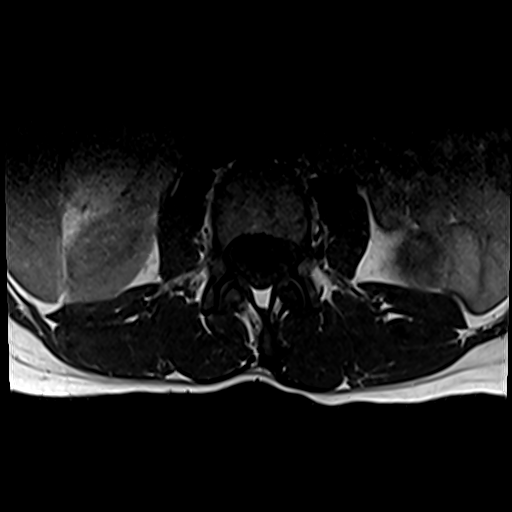
[im 28/39]
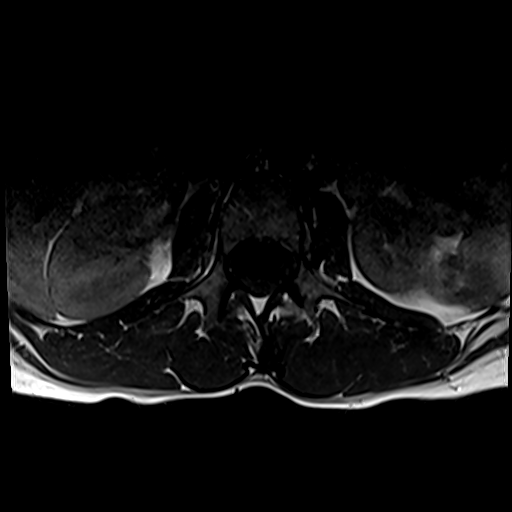
[im 33/39]
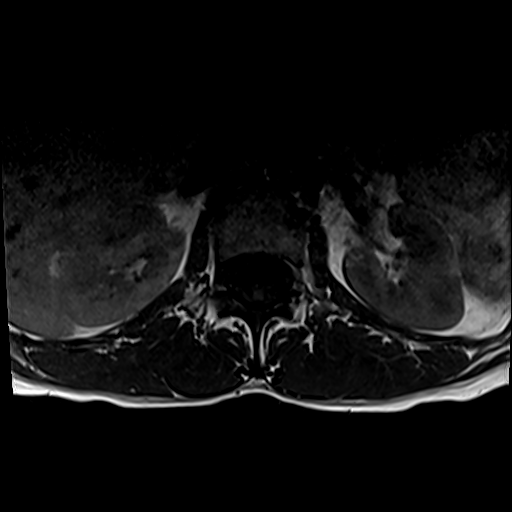
[im 39/39]
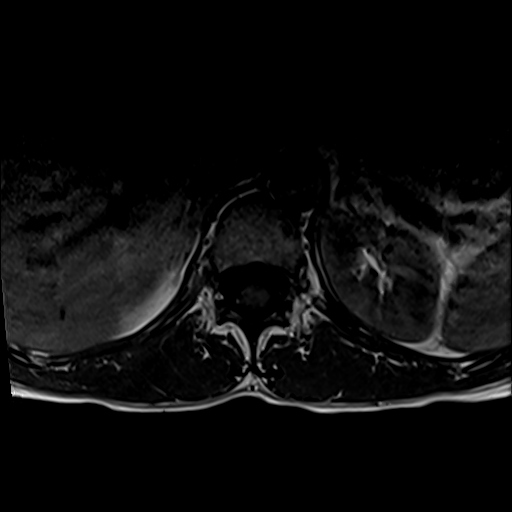

[32 of 48 positions shown; findings below may reference images not displayed]

FINDINGS: Segmentation: For the purposes of this dictation, five lumbar
vertebrae are assumed and the caudal most well-formed intervertebral
disc is designated L5-S1.

Alignment: Straightening of the expected lumbar lordosis. Mild L4-L5
grade 1 retrolisthesis

Vertebrae: Vertebral body height is maintained. No marrow edema or
suspicious osseous lesion.

Conus medullaris and cauda equina: Conus extends to the L2 level. No
signal abnormality within the visualized distal spinal cord.

Paraspinal and other soft tissues: No abnormality identified within
included portions of the abdomen/retroperitoneum. Paraspinal soft
tissues within normal limits.

Disc levels:

Mild L4-L5 disc degeneration.

There is a congenitally capacious lumbar and sacral spinal canal.
There are large Tarlov cysts within the visualized sacral canal with
scalloping of the sacral segments and anterior displacement of the
nerve roots within the spinal canal.

T12-L1: No significant disc herniation or stenosis.

L1-L2: No significant disc herniation or stenosis.

L2-L3: No significant disc herniation or stenosis.

L3-L4: Mild facet hypertrophy. No significant disc herniation or
stenosis.

L4-L5: Disc bulge. Central posterior annular fissure. Mild
facet/ligamentum flavum hypertrophy. There is mild bilateral
subarticular narrowing without frank nerve root impingement. Central
canal patent. No significant foraminal stenosis.

L5-S1: Mild facet hypertrophy. No significant disc herniation or
stenosis.
IMPRESSION: Congenitally capacious lumbar and sacral spinal canal. There are
large Tarlov cysts within the visualized sacral canal with
scalloping of the sacral segments and anterior displacement of the
nerve roots within the spinal canal.

Lumbar spondylosis as outlined and most notably as follows.

At L4-L5, there is mild disc degeneration. Disc bulge with central
posterior annular fissure. Mild facet/ligamentum flavum hypertrophy.
Mild bilateral subarticular narrowing without frank nerve root
impingement. No significant central canal or foraminal stenosis.

Please refer to MRI of the sacrum/pelvis separately reported for
additional findings.

## 2019-12-29 IMAGING — MR MR SACRUM / SI JOINTS WO CM
5 series · 48 of 48 positions shown · non-contrast
Comparison: None.
COMPARISON: None.

Addendum:
CLINICAL DATA: Low back pain status post fall, right leg pain.

EXAM:
MRI SACRUM WITHOUT CONTRAST
TECHNIQUE: Multiplanar, multisequence MR imaging of the sacrum was performed.
No intravenous contrast was administered.

[Series 11: T1 · axial · 4.0mm · 0.78mm/px · z∈[-237,-86]mm · 9 of 34 slices shown (1 of 2)]
[im 1/34]
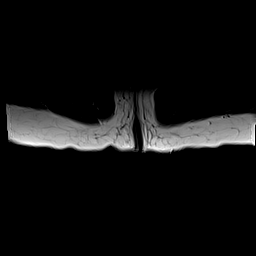
[im 5/34]
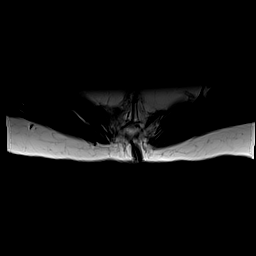
[im 9/34]
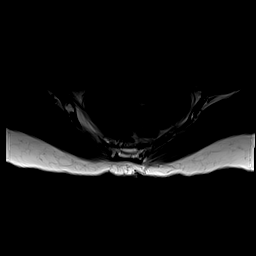
[im 13/34]
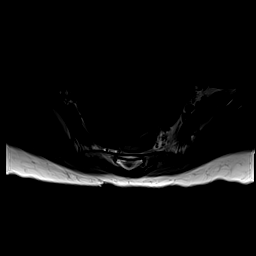
[im 17/34]
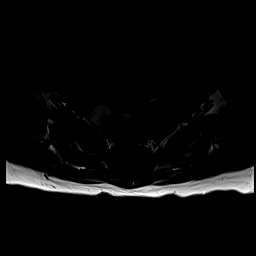
[im 21/34]
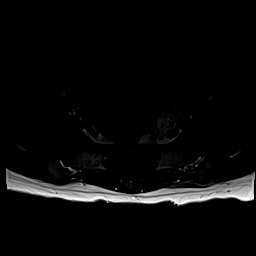
[im 25/34]
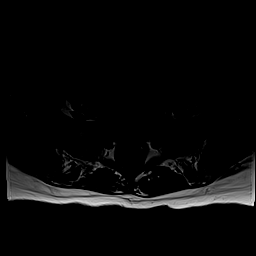
[im 29/34]
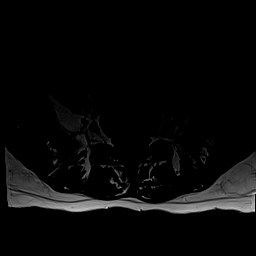
[im 34/34]
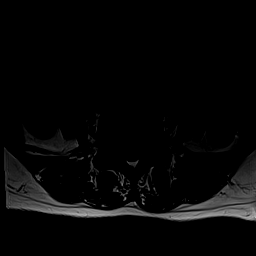

[Series 12: T2 fat-sat · axial · 4.0mm · 0.78mm/px · z∈[-237,-86]mm · 9 of 34 slices shown (1 of 2)]
[im 1/34]
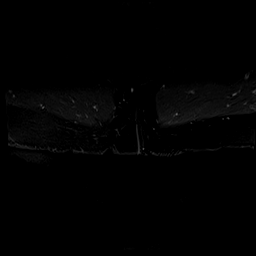
[im 5/34]
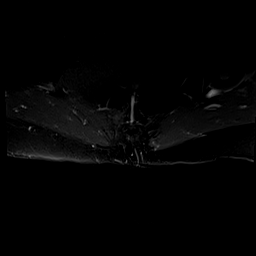
[im 9/34]
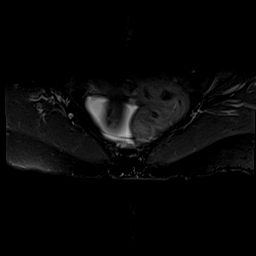
[im 13/34]
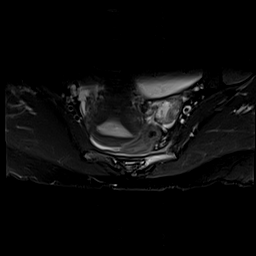
[im 17/34]
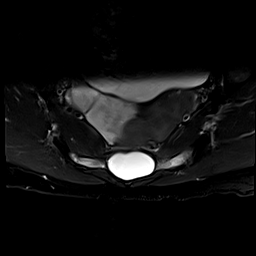
[im 21/34]
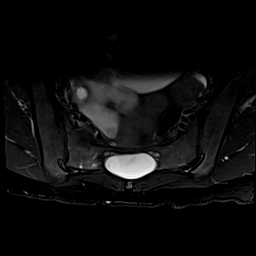
[im 25/34]
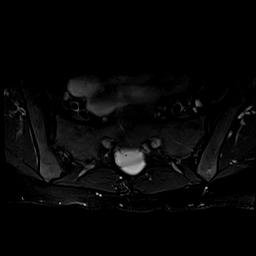
[im 29/34]
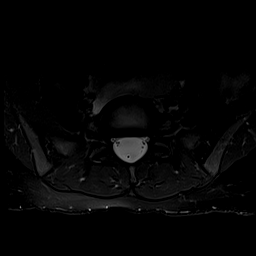
[im 34/34]
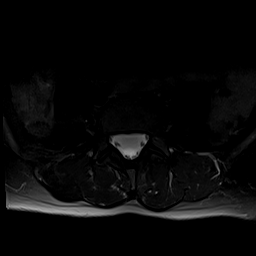

[Series 13: T1 · coronal · 3.0mm · 1.17mm/px · 10 of 35 slices shown (2 of 2)]
[im 1/35]
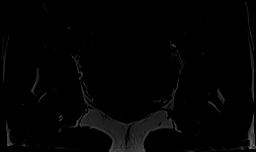
[im 4/35]
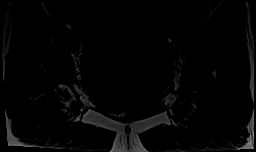
[im 8/35]
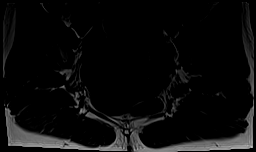
[im 12/35]
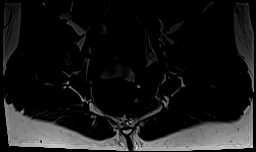
[im 16/35]
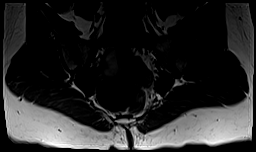
[im 19/35]
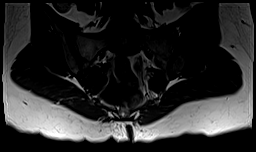
[im 23/35]
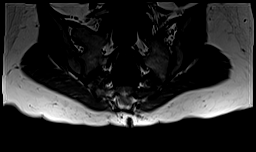
[im 27/35]
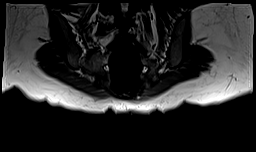
[im 31/35]
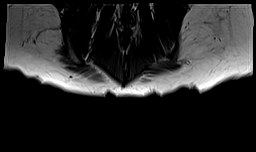
[im 35/35]
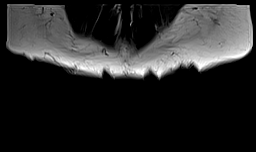

[Series 14: STIR · coronal · 3.0mm · 0.59mm/px · 10 of 35 slices shown]
[im 1/35]
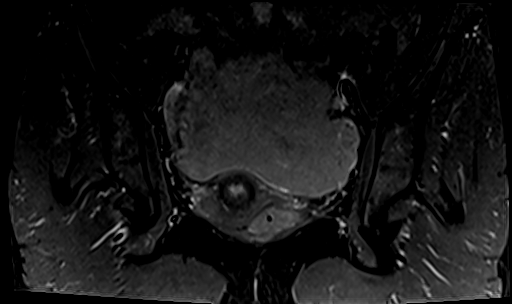
[im 4/35]
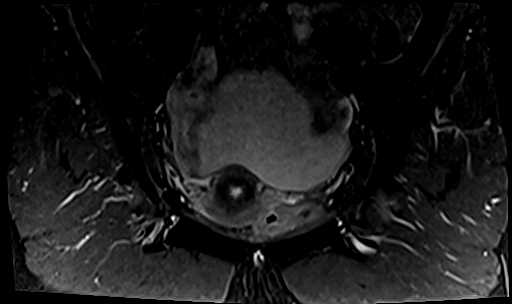
[im 8/35]
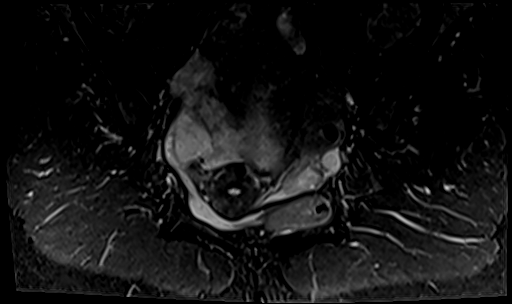
[im 12/35]
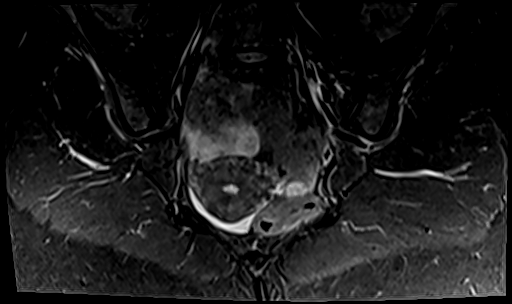
[im 16/35]
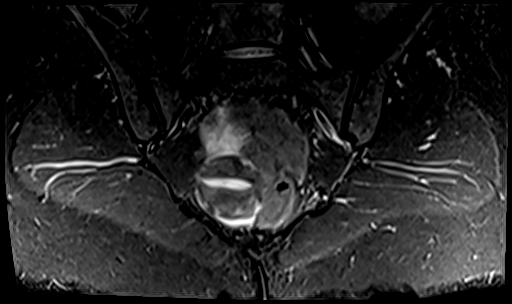
[im 19/35]
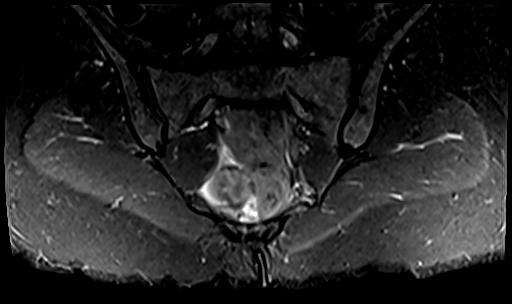
[im 23/35]
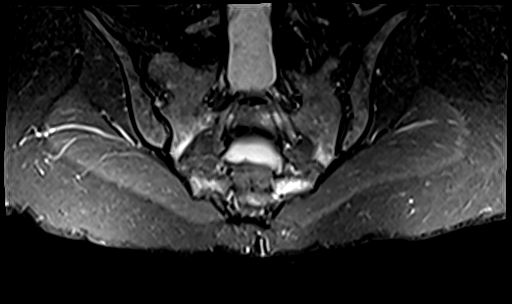
[im 27/35]
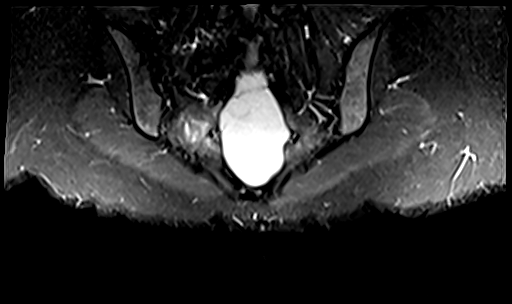
[im 31/35]
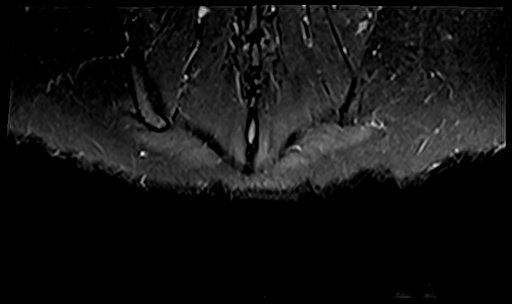
[im 35/35]
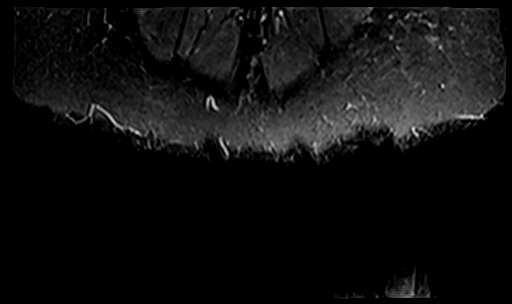

[Series 15: T2 fat-sat · sagittal · 4.0mm · 0.62mm/px · 10 of 36 slices shown (2 of 2)]
[im 1/36]
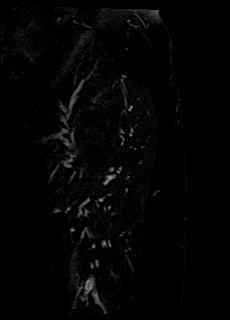
[im 4/36]
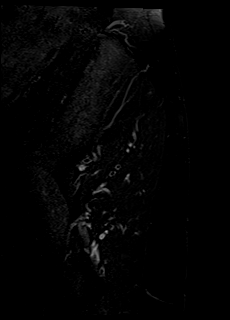
[im 8/36]
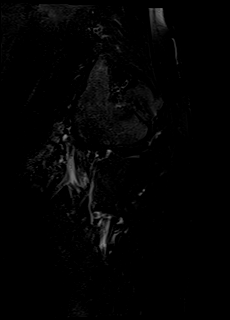
[im 12/36]
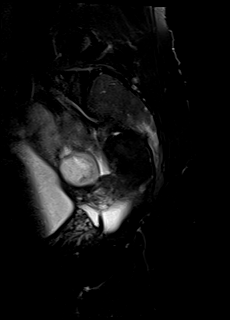
[im 16/36]
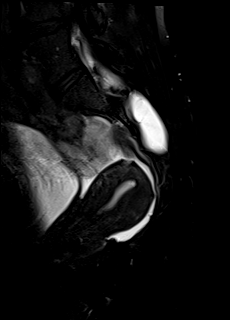
[im 20/36]
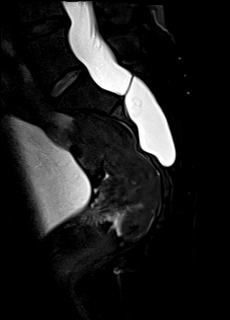
[im 24/36]
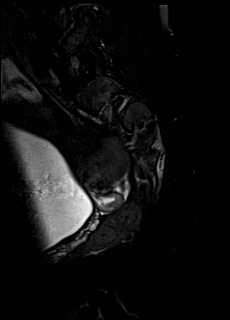
[im 28/36]
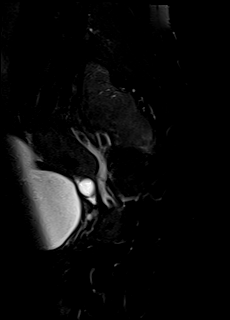
[im 32/36]
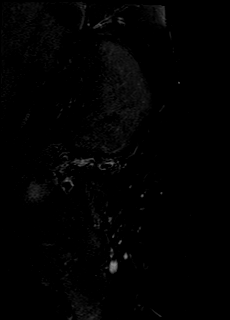
[im 36/36]
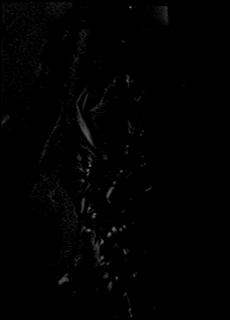

[48 of 48 positions shown; findings below may reference images not displayed]

FINDINGS: Bones/Joint/Cartilage

Focal marrow edema in the inferior right and left sacral ala with a
subtle linear component concerning for nondisplaced fractures.
Fractures are in close proximity to the S3 and S4 neural foramina.

No other fracture or subluxation. Sacroiliac joints are normal
without widening or erosive changes. No SI joint effusion.

Normal alignment. No joint effusion.

Broad-based disc bulge at L4-5 with a central annular fissure. This
is better evaluated on the MRI of the lumbar spine performed the
same day and reported separately.

Large sacral Tarlov cyst with scalloping of the posterior margin of
the S2 and S3 vertebral bodies.

Muscles and Tendons
Muscles are normal.  No muscle atrophy.

Soft tissue
Small amount of pelvic free fluid. No fluid collection or hematoma.
Visualized uterus demonstrates no focal abnormality. No soft tissue
mass.
IMPRESSION: 1. Focal marrow edema in the inferior right and left sacral ala with
a subtle linear component concerning for nondisplaced fractures.

ADDENDUM:
Large sacral Tarlov cyst with anterior displacement of the sacral
nerve roots. Dural ectasia with scalloping of the S2 and S3
vertebral bodies.

*** End of Addendum ***
FINDINGS: Bones/Joint/Cartilage

Focal marrow edema in the inferior right and left sacral ala with a
subtle linear component concerning for nondisplaced fractures.
Fractures are in close proximity to the S3 and S4 neural foramina.

No other fracture or subluxation. Sacroiliac joints are normal
without widening or erosive changes. No SI joint effusion.

Normal alignment. No joint effusion.

Broad-based disc bulge at L4-5 with a central annular fissure. This
is better evaluated on the MRI of the lumbar spine performed the
same day and reported separately.

Large sacral Tarlov cyst with scalloping of the posterior margin of
the S2 and S3 vertebral bodies.

Muscles and Tendons
Muscles are normal.  No muscle atrophy.

Soft tissue
Small amount of pelvic free fluid. No fluid collection or hematoma.
Visualized uterus demonstrates no focal abnormality. No soft tissue
mass.
IMPRESSION: 1. Focal marrow edema in the inferior right and left sacral ala with
a subtle linear component concerning for nondisplaced fractures.

## 2019-12-29 MED ORDER — KETOROLAC TROMETHAMINE 30 MG/ML IJ SOLN
60.0000 mg | Freq: Once | INTRAMUSCULAR | Status: AC
  Administered 2019-12-29: 60 mg via INTRAMUSCULAR
  Filled 2019-12-29: qty 2

## 2019-12-29 MED ORDER — ONDANSETRON HCL 4 MG PO TABS
4.0000 mg | ORAL_TABLET | Freq: Four times a day (QID) | ORAL | 0 refills | Status: DC | PRN
Start: 2019-12-29 — End: 2021-02-20

## 2019-12-29 MED ORDER — OXYCODONE-ACETAMINOPHEN 5-325 MG PO TABS: 1.0000 | ORAL_TABLET | Freq: Four times a day (QID) | ORAL | 0 refills | Status: DC | PRN

## 2019-12-29 MED ORDER — HYDROMORPHONE HCL 2 MG/ML IJ SOLN
2.0000 mg | Freq: Once | INTRAMUSCULAR | Status: AC
  Administered 2019-12-29: 2 mg via INTRAMUSCULAR
  Filled 2019-12-29: qty 1

## 2019-12-29 MED ORDER — PREDNISONE 10 MG PO TABS
20.0000 mg | ORAL_TABLET | Freq: Two times a day (BID) | ORAL | 0 refills | Status: DC
Start: 2019-12-29 — End: 2021-02-20

## 2019-12-29 NOTE — ED Notes (Signed)
Pt discharged from this ED in stable condition at this time. All discharge instructions and follow up care reviewed with pt with no further questions at this time. Pt ambulatory with assistance.

## 2019-12-29 NOTE — ED Notes (Signed)
Patient transported to MRI 

## 2019-12-29 NOTE — ED Notes (Signed)
Ortho tech made aware patient needs crutches.

## 2019-12-29 NOTE — ED Provider Notes (Signed)
[facility] DEPT Provider Note   CSN: [id number] Arrival date & time: 12/29/19  1212     History Chief Complaint  Patient presents with  . Fall    Back Pain     Vanessa Reese is a 44 y.o. female.  Patient is a 44 year old female with history of migraines.  She presents today for evaluation of low back pain.  Patient states that she fell several weeks ago and injured her low back/buttock.  She was seen here and had a CT scan of the lumbar spine which was unremarkable.  She was discharged home.  She was improving somewhat until today when she bent over to pick up an object, then began to experience worsening pain in her low back and sacral region.  She reports pain radiating into her right leg.  She denies any bowel or bladder complaints.  The history is provided by the patient.  Fall This is a new problem. Episode onset: 3 weeks ago. The problem occurs constantly. The problem has been gradually worsening. Exacerbated by: movement, palpation, and sitting. Nothing relieves the symptoms. Treatments tried: nsaids. The treatment provided no relief.       Past Medical History:  Diagnosis Date  . Bilateral ovarian cysts   . Migraine     There are no problems to display for this patient.   Past Surgical History:  Procedure Laterality Date  . OVARIAN CYST REMOVAL    . TONSILLECTOMY       OB History   No obstetric history on file.     History reviewed. No pertinent family history.  Social History   Tobacco Use  . Smoking status: Former Research scientist (life sciences)  . Smokeless tobacco: Never Used  Substance Use Topics  . Alcohol use: Yes    Comment: Occasionally  . Drug use: No    Home Medications Prior to Admission medications   Medication Sig Start Date End Date Taking? Authorizing Provider  acetaminophen (TYLENOL 8 HOUR) 650 MG CR tablet Take 1 tablet (650 mg total) by mouth every 6 (six) hours. 12/10/19   Varney Biles, MD  ALPRAZolam Duanne Moron) 1 MG  tablet Take 1 mg by mouth 3 (three) times daily as needed for anxiety.  09/12/19   [provider]  buPROPion (WELLBUTRIN XL) 150 MG 24 hr tablet Take 150 mg by mouth every morning. 09/12/19   [provider]  buPROPion (WELLBUTRIN XL) 300 MG 24 hr tablet Take 300 mg by mouth in the morning.  09/12/19   [provider]  cyclobenzaprine (FLEXERIL) 10 MG tablet Take 5-10 mg by mouth 3 (three) times daily as needed for muscle spasms.  12/10/19   [provider]  cyclobenzaprine (FLEXERIL) 5 MG tablet Take 1 tablet (5 mg total) by mouth 3 (three) times daily as needed for muscle spasms. Patient not taking: Reported on 12/10/2019 10/15/18   Shelda Pal, DO  gabapentin (NEURONTIN) 600 MG tablet Take 600 mg by mouth at bedtime.  10/25/19   [provider]  HYDROcodone-acetaminophen (NORCO/VICODIN) 5-325 MG tablet Take 1 tablet by mouth every 8 (eight) hours. 12/10/19   Varney Biles, MD  ibuprofen (ADVIL) 600 MG tablet Take 1 tablet (600 mg total) by mouth 3 (three) times daily. 12/10/19   Varney Biles, MD  meloxicam (MOBIC) 7.5 MG tablet Take 7.5 mg by mouth 2 (two) times daily as needed for pain.  12/10/19   [provider]  methocarbamol (ROBAXIN) 500 MG tablet Take 1 tablet (500 mg total)  by mouth 2 (two) times daily. 12/10/19   Varney Biles, MD  sertraline (ZOLOFT) 100 MG tablet Take 100 mg by mouth daily.  09/12/19   [provider]  topiramate (TOPAMAX) 100 MG tablet Take 100 mg by mouth daily. 10/25/19   [provider]    Allergies    Imitrex [sumatriptan]  Review of Systems   Review of Systems  All other systems reviewed and are negative.   Physical Exam Updated Vital Signs BP 118/70 (BP Location: Right Arm)   Pulse 75   Temp 99.7 F (37.6 C) (Oral)   Resp 16   SpO2 98%   Physical Exam Vitals and nursing note reviewed.  Constitutional:      General: She is not in acute distress.    Appearance: She is  well-developed. She is not diaphoretic.  HENT:     Head: Normocephalic and atraumatic.  Cardiovascular:     Rate and Rhythm: Normal rate and regular rhythm.     Heart sounds: No murmur heard.  No friction rub. No gallop.   Pulmonary:     Effort: Pulmonary effort is normal. No respiratory distress.     Breath sounds: Normal breath sounds. No wheezing.  Abdominal:     General: Bowel sounds are normal. There is no distension.     Palpations: Abdomen is soft.     Tenderness: There is no abdominal tenderness.  Musculoskeletal:        General: Normal range of motion.     Cervical back: Normal range of motion and neck supple.     Comments: There is tenderness to palpation in the lower lumbar and sacral region.  There is no palpable abnormality.  Strength is 5 out of 5 in both lower extremities and sensation is intact throughout both legs.  Skin:    General: Skin is warm and dry.  Neurological:     Mental Status: She is alert and oriented to person, place, and time.     ED Results / Procedures / Treatments   Labs (all labs ordered are listed, but only abnormal results are displayed) Labs Reviewed - No data to display  EKG None  Radiology No results found.  Procedures Procedures (including critical care time)  Medications Ordered in ED Medications  ketorolac (TORADOL) 30 MG/ML injection 60 mg (60 mg Intramuscular Given 12/29/19 1304)  HYDROmorphone (DILAUDID) injection 2 mg (2 mg Intramuscular Given 12/29/19 1304)    ED Course  I have reviewed the triage vital signs and the nursing notes.  Pertinent labs & imaging results that were available during my care of the patient were reviewed by me and considered in my medical decision making (see chart for details).    MDM Rules/Calculators/A&P  Patient presenting here with ongoing pain in her low back/sacrum after a fall 3 weeks ago.  She was seen here and had CT scan of the lumbar spine showing no acute abnormality.  Today  patient was exerting herself when she developed recurrence of the discomfort to her low back/buttock region.  She is describing severe discomfort that radiates into her legs.  Patient given pain medication here in the ER.  She was sent for MRI of the lumbar spine and sacrum.  This reveals what appear to be sacral fractures.  There are also other findings on the MRI which will be discussed with neurosurgery.  If these do not appear to be a surgical issue, patient will be discharged with pain medication and weightbearing as tolerated.  She is to follow-up with primary doctor for recheck in the next 1 to 2 weeks.  Final Clinical Impression(s) / ED Diagnoses Final diagnoses:  None    Rx / DC Orders ED Discharge Orders    None       Veryl Speak, MD 12/29/19 1553

## 2019-12-29 NOTE — ED Triage Notes (Addendum)
Pt arrived via [facility] from home CC back pain and right leg pain . Pt reports fall 3 weeks ago and " fractured tail bone" today was chasing dog around yard and fell" . Pt denies LOC head or neck pain. Per EMS a/OX4 non ambulatory on scene. Per EMS bilateral pulses and movement.   Hx CKD stage 3, Migraines   BP 118 palpated

## 2019-12-29 NOTE — Discharge Instructions (Addendum)
Take prednisone as prescribed for inflammation.  Begin taking Percocet as prescribed as needed for pain.  Weightbearing as tolerated using crutches for support.  Follow-up with your primary doctor in the next 1 to 2 weeks for recheck, and return to the ER if symptoms significantly worsen or change.

## 2019-12-29 NOTE — Progress Notes (Signed)
Orthopedic Tech Progress Note Patient Details:  Vanessa Reese [birth year] [id number] Set up crutches and crutch training  Patient ID: Vanessa Reese, female   DOB: 09-26-1975, 44 y.o.   MRN: [id number]   Braulio Bosch 12/29/2019, 4:26 PM

## 2020-02-20 DIAGNOSIS — G96198 Other disorders of meninges, not elsewhere classified: Secondary | ICD-10-CM | POA: Insufficient documentation

## 2020-02-20 DIAGNOSIS — I341 Nonrheumatic mitral (valve) prolapse: Secondary | ICD-10-CM | POA: Insufficient documentation

## 2020-06-07 ENCOUNTER — Telehealth: Payer: BLUE CROSS/BLUE SHIELD | Admitting: Family

## 2020-06-07 DIAGNOSIS — U071 COVID-19: Secondary | ICD-10-CM

## 2020-06-07 MED ORDER — PROMETHAZINE-DM 6.25-15 MG/5ML PO SYRP: 5.0000 mL | ORAL_SOLUTION | Freq: Four times a day (QID) | ORAL | 0 refills | Status: DC | PRN

## 2020-06-07 NOTE — Progress Notes (Signed)
E-Visit for Corona Virus Screening  We are sorry you are not feeling well. We are here to help!  You have tested positive for COVID-19, meaning that you were infected with the novel coronavirus and could give the virus to others.  It is vitally important that you stay home so you do not spread it to others.      Please continue isolation at home, for at least 10 days since the start of your symptoms and until you have had 24 hours with no fever (without taking a fever reducer) and with improving of symptoms.  Most cases improve 10 days from onset but we have seen a small number of patients who have gotten worse after the 10 days.  Please be sure to watch for worsening symptoms and remain taking the proper precautions.   Go to the nearest hospital ED for assessment if fever/cough/breathlessness are severe or illness seems like a threat to life.    The following symptoms may appear 2-14 days after exposure: . Fever . Cough . Shortness of breath or difficulty breathing . Chills . Repeated shaking with chills . Muscle pain . Headache . Sore throat . New loss of taste or smell . Fatigue . Congestion or runny nose . Nausea or vomiting . Diarrhea  You have been enrolled in [facility] Monitoring for COVID-19. Daily you will receive a questionnaire within the MyChart website. Our COVID-19 response team will be monitoring your responses daily.  You can use medication such as A prescription cough medication called Phenergan DM 6.25 mg/15 mg. You make take one teaspoon / 5 ml every 4-6 hours as needed for cough  You have tested positive for Covid but because you are not considered high risk you do not qualify for monoclonal antibody infusion.  Supportive care is all that is needed.   You may also take acetaminophen (Tylenol) as needed for fever.  HOME CARE: . Only take medications as instructed by your medical team. . Drink plenty of fluids and get plenty of rest. . A steam or ultrasonic  humidifier can help if you have congestion.   GET HELP RIGHT AWAY IF YOU HAVE EMERGENCY WARNING SIGNS.  Call 911 or proceed to your closest emergency facility if: . You develop worsening high fever. . Trouble breathing . Bluish lips or face . Persistent pain or pressure in the chest . New confusion . Inability to wake or stay awake . You cough up blood. . Your symptoms become more severe . Inability to hold down food or fluids  This list is not all possible symptoms. Contact your medical provider for any symptoms that are severe or concerning to you.    Your e-visit answers were reviewed by a board certified advanced clinical practitioner to complete your personal care plan.  Depending on the condition, your plan could have included both over the counter or prescription medications.  If there is a problem please reply once you have received a response from your provider.  Your safety is important to [place].  If you have drug allergies check your prescription carefully.    You can use MyChart to ask questions about today's visit, request a non-urgent call back, or ask for a work or school excuse for 24 hours related to this e-Visit. If it has been greater than 24 hours you will need to follow up with your provider, or enter a new e-Visit to address those concerns. You will get an e-mail in the next two days asking  about your experience.  I hope that your e-visit has been valuable and will speed your recovery. Thank you for using e-visits.  Greater than 5 minutes, yet less than 10 minutes of time have been spent researching, coordinating, and implementing care for this patient today.  Thank you for the details you included in the comment boxes. Those details are very helpful in determining the best course of treatment for you and help [place] to provide the best care.

## 2020-08-14 DIAGNOSIS — M533 Sacrococcygeal disorders, not elsewhere classified: Secondary | ICD-10-CM | POA: Insufficient documentation

## 2020-08-14 DIAGNOSIS — N3942 Incontinence without sensory awareness: Secondary | ICD-10-CM | POA: Insufficient documentation

## 2021-02-20 ENCOUNTER — Other Ambulatory Visit: Payer: Self-pay

## 2021-02-20 ENCOUNTER — Encounter: Payer: Self-pay | Admitting: Internal Medicine

## 2021-02-20 ENCOUNTER — Ambulatory Visit (INDEPENDENT_AMBULATORY_CARE_PROVIDER_SITE_OTHER): Payer: Self-pay | Admitting: Internal Medicine

## 2021-02-20 VITALS — BP 124/78 | HR 77 | Temp 98.7°F | Resp 16 | Ht 63.0 in | Wt 122.0 lb

## 2021-02-20 DIAGNOSIS — H6501 Acute serous otitis media, right ear: Secondary | ICD-10-CM

## 2021-02-20 DIAGNOSIS — Z0001 Encounter for general adult medical examination with abnormal findings: Secondary | ICD-10-CM

## 2021-02-20 MED ORDER — AMOXICILLIN-POT CLAVULANATE 875-125 MG PO TABS: 1.0000 | ORAL_TABLET | Freq: Two times a day (BID) | ORAL | 0 refills | Status: AC

## 2021-02-20 MED ORDER — METHYLPREDNISOLONE 4 MG PO TBPK: ORAL_TABLET | ORAL | 0 refills | Status: AC

## 2021-02-20 NOTE — Progress Notes (Addendum)
Subjective:  Patient ID: Vanessa Reese, female    DOB: [birth year]  Age: 45 y.o. MRN: [id number]  CC: Ear Pain  This visit occurred during the SARS-CoV-2 public health emergency.  Safety protocols were in place, including screening questions prior to the visit, additional usage of staff PPE, and extensive cleaning of exam room while observing appropriate contact time as indicated for disinfecting solutions.    HPI Gizell Jurney presents for establishing.  She complains of a 1 week history of right-sided ear pain.  Her hearing has been normal and she denies headache, rash, lymphadenopathy, bleeding or discharge from the right ear, sore throat, fever, or chills.  History Zalea has a past medical history of Bilateral ovarian cysts and Migraine.   She has a past surgical history that includes Ovarian cyst removal and Tonsillectomy.   Her family history is not on file.She reports that she has quit smoking. She has never used smokeless tobacco. She reports that she does not currently use alcohol. She reports that she does not use drugs.  Outpatient Medications Prior to Visit  Medication Sig Dispense Refill   gabapentin (NEURONTIN) 600 MG tablet Take 600 mg by mouth at bedtime.      buPROPion (WELLBUTRIN XL) 150 MG 24 hr tablet Take 150 mg by mouth every morning.     buPROPion (WELLBUTRIN XL) 300 MG 24 hr tablet Take 300 mg by mouth in the morning.      sertraline (ZOLOFT) 100 MG tablet Take 100 mg by mouth daily.      topiramate (TOPAMAX) 100 MG tablet Take 100 mg by mouth daily.     acetaminophen (TYLENOL 8 HOUR) 650 MG CR tablet Take 1 tablet (650 mg total) by mouth every 6 (six) hours. 15 tablet 0   ALPRAZolam (XANAX) 1 MG tablet Take 1 mg by mouth 3 (three) times daily as needed for anxiety.      cyclobenzaprine (FLEXERIL) 10 MG tablet Take 5-10 mg by mouth 3 (three) times daily as needed for muscle spasms.      cyclobenzaprine (FLEXERIL) 5 MG tablet Take 1 tablet (5 mg total) by  mouth 3 (three) times daily as needed for muscle spasms. (Patient not taking: Reported on 12/10/2019) 21 tablet 0   HYDROcodone-acetaminophen (NORCO/VICODIN) 5-325 MG tablet Take 1 tablet by mouth every 8 (eight) hours. 4 tablet 0   ibuprofen (ADVIL) 600 MG tablet Take 1 tablet (600 mg total) by mouth 3 (three) times daily. 15 tablet 0   meloxicam (MOBIC) 7.5 MG tablet Take 7.5 mg by mouth 2 (two) times daily as needed for pain.      methocarbamol (ROBAXIN) 500 MG tablet Take 1 tablet (500 mg total) by mouth 2 (two) times daily. 10 tablet 0   ondansetron (ZOFRAN) 4 MG tablet Take 1 tablet (4 mg total) by mouth every 6 (six) hours as needed for nausea or vomiting. 12 tablet 0   oxyCODONE-acetaminophen (PERCOCET) 5-325 MG tablet Take 1-2 tablets by mouth every 6 (six) hours as needed. 20 tablet 0   predniSONE (DELTASONE) 10 MG tablet Take 2 tablets (20 mg total) by mouth 2 (two) times daily with a meal. 20 tablet 0   promethazine-dextromethorphan (PROMETHAZINE-DM) 6.25-15 MG/5ML syrup Take 5 mLs by mouth 4 (four) times daily as needed. 118 mL 0   No facility-administered medications prior to visit.    ROS Review of Systems  Constitutional:  Negative for chills, diaphoresis, fatigue and fever.  HENT:  Positive for ear pain. Negative for ear  discharge, rhinorrhea, sinus pressure, sore throat and trouble swallowing.   Eyes: Negative.   Respiratory:  Negative for cough and shortness of breath.   Cardiovascular:  Negative for chest pain, palpitations and leg swelling.  Gastrointestinal:  Negative for abdominal pain.  Endocrine: Negative.   Genitourinary: Negative.   Musculoskeletal: Negative.   Skin: Negative.  Negative for rash.  Neurological:  Negative for dizziness, weakness and numbness.  Hematological:  Negative for adenopathy. Does not bruise/bleed easily.  Psychiatric/Behavioral: Negative.     Objective:  BP 124/78 (BP Location: Right Arm, Patient Position: Sitting, Cuff Size: Large)    Pulse 77   Temp 98.7 F (37.1 C) (Oral)   Resp 16   Ht '5\' 3"'$  (1.6 m)   Wt 122 lb (55.3 kg)   LMP 01/20/2021 (Exact Date)   SpO2 98%   BMI 21.61 kg/m   Physical Exam Vitals reviewed.  Constitutional:      Appearance: Normal appearance.  HENT:     Right Ear: Hearing, ear canal and external ear normal. A middle ear effusion is present. No hemotympanum. Tympanic membrane is bulging. Tympanic membrane is not perforated or erythematous.     Left Ear: Hearing, tympanic membrane, ear canal and external ear normal.     Nose: Nose normal.     Mouth/Throat:     Mouth: Mucous membranes are moist.  Eyes:     General: No scleral icterus.    Conjunctiva/sclera: Conjunctivae normal.  Cardiovascular:     Rate and Rhythm: Normal rate and regular rhythm.     Pulses: Normal pulses.     Heart sounds: No murmur heard. Pulmonary:     Effort: Pulmonary effort is normal.     Breath sounds: No stridor. No wheezing, rhonchi or rales.  Abdominal:     General: Abdomen is flat.     Palpations: There is no mass.     Tenderness: There is no abdominal tenderness. There is no guarding.     Hernia: No hernia is present.  Musculoskeletal:        General: Normal range of motion.     Cervical back: Neck supple. No rigidity.     Right lower leg: No edema.     Left lower leg: No edema.  Lymphadenopathy:     Head:     Right side of head: No submental, submandibular, preauricular, posterior auricular or occipital adenopathy.     Left side of head: No submental, submandibular, preauricular, posterior auricular or occipital adenopathy.     Cervical: No cervical adenopathy.     Right cervical: No superficial cervical adenopathy.    Left cervical: No superficial cervical adenopathy.     Upper Body:     Right upper body: No supraclavicular adenopathy.     Left upper body: No supraclavicular adenopathy.  Skin:    General: Skin is warm and dry.     Findings: No rash.  Neurological:     General: No focal  deficit present.     Mental Status: She is alert.  Psychiatric:        Mood and Affect: Mood normal.    Lab Results  Component Value Date   WBC 5.3 04/10/2021   HGB 13.5 04/10/2021   HCT 40.8 04/10/2021   PLT 263.0 04/10/2021   GLUCOSE 95 04/10/2021   CHOL 239 (H) 04/10/2021   TRIG 118.0 04/10/2021   HDL 55.50 04/10/2021   LDLCALC 160 (H) 04/10/2021   ALT 9 04/10/2021   AST 12 04/10/2021  NA 139 04/10/2021   K 4.4 04/10/2021   CL 110 04/10/2021   CREATININE 0.97 04/10/2021   BUN 13 04/10/2021   CO2 23 04/10/2021   TSH 2.14 04/10/2021     Assessment & Plan:   Jonny was seen today for ear pain.  Diagnoses and all orders for this visit:  Non-recurrent acute serous otitis media of right ear- There is symptomatic fluid in the right ear.  Will treat for bacterial otitis media with Augmentin.  Will prescribe a course of methylprednisolone to reduce the pain and fluid collection in the middle ear. -     methylPREDNISolone (MEDROL DOSEPAK) 4 MG TBPK tablet; TAKE AS DIRECTED -     amoxicillin-clavulanate (AUGMENTIN) 875-125 MG tablet; Take 1 tablet by mouth 2 (two) times daily for 10 days.  I have discontinued Chi Shuster's cyclobenzaprine, ALPRAZolam, meloxicam, cyclobenzaprine, ibuprofen, acetaminophen, methocarbamol, HYDROcodone-acetaminophen, oxyCODONE-acetaminophen, predniSONE, ondansetron, and promethazine-dextromethorphan. I am also having her start on methylPREDNISolone and amoxicillin-clavulanate. Additionally, I am having her maintain her gabapentin.  Meds ordered this encounter  Medications   methylPREDNISolone (MEDROL DOSEPAK) 4 MG TBPK tablet    Sig: TAKE AS DIRECTED    Dispense:  21 tablet    Refill:  0   amoxicillin-clavulanate (AUGMENTIN) 875-125 MG tablet    Sig: Take 1 tablet by mouth 2 (two) times daily for 10 days.    Dispense:  20 tablet    Refill:  0     Follow-up: Return if symptoms worsen or fail to improve.  Scarlette Calico, MD

## 2021-02-20 NOTE — Patient Instructions (Signed)

## 2021-02-21 ENCOUNTER — Encounter: Payer: Self-pay | Admitting: Internal Medicine

## 2021-03-04 ENCOUNTER — Telehealth: Payer: Self-pay

## 2021-03-04 ENCOUNTER — Other Ambulatory Visit: Payer: Self-pay | Admitting: Internal Medicine

## 2021-03-04 DIAGNOSIS — F324 Major depressive disorder, single episode, in partial remission: Secondary | ICD-10-CM

## 2021-03-04 MED ORDER — SERTRALINE HCL 100 MG PO TABS
200.0000 mg | ORAL_TABLET | Freq: Every day | ORAL | 1 refills | Status: DC
Start: 2021-03-04 — End: 2021-09-06

## 2021-03-04 MED ORDER — TOPIRAMATE 200 MG PO TABS: 200.0000 mg | ORAL_TABLET | Freq: Two times a day (BID) | ORAL | 1 refills | Status: DC

## 2021-03-04 MED ORDER — BUPROPION HCL ER (XL) 300 MG PO TB24
300.0000 mg | ORAL_TABLET | Freq: Every morning | ORAL | 1 refills | Status: DC
Start: 2021-03-04 — End: 2021-09-06

## 2021-03-04 NOTE — Telephone Encounter (Signed)
Please advise as the pt has stated she needs all her medications refilled by Dr. Ronnald Ramp as she is now under his care.  Medications needed: buPROPion (WELLBUTRIN XL) 150 MG 24 hr tablet, buPROPion (WELLBUTRIN XL) 300 MG 24 hr tablet, sertraline (ZOLOFT) 100 MG tablet pt states she is taking this medication at 200mg  not the 100mg , and topiramate (TOPAMAX) 100 MG tablet which pt states she I taking at 200mg  and not the 100mg .

## 2021-04-09 ENCOUNTER — Telehealth: Payer: Self-pay | Admitting: Internal Medicine

## 2021-04-09 ENCOUNTER — Other Ambulatory Visit: Payer: Self-pay | Admitting: Internal Medicine

## 2021-04-09 DIAGNOSIS — Z79899 Other long term (current) drug therapy: Secondary | ICD-10-CM

## 2021-04-09 DIAGNOSIS — Z0001 Encounter for general adult medical examination with abnormal findings: Secondary | ICD-10-CM

## 2021-04-09 DIAGNOSIS — F32A Depression, unspecified: Secondary | ICD-10-CM | POA: Insufficient documentation

## 2021-04-09 DIAGNOSIS — Z1159 Encounter for screening for other viral diseases: Secondary | ICD-10-CM

## 2021-04-09 DIAGNOSIS — F325 Major depressive disorder, single episode, in full remission: Secondary | ICD-10-CM

## 2021-04-09 NOTE — Telephone Encounter (Signed)
Patient calling in  Patient has cpe scheduled 11/14 & has a surgical procedure scheduled 11/15  Patient is requesting lab orders be placed to have everything checked before her upcoming visits  Please let patient know when lab orders have been submitted [phone]

## 2021-04-09 NOTE — Telephone Encounter (Signed)
Called pt, LVM stating labs are ordered.

## 2021-04-10 ENCOUNTER — Other Ambulatory Visit (INDEPENDENT_AMBULATORY_CARE_PROVIDER_SITE_OTHER): Payer: BLUE CROSS/BLUE SHIELD

## 2021-04-10 ENCOUNTER — Other Ambulatory Visit: Payer: Self-pay

## 2021-04-10 DIAGNOSIS — Z1159 Encounter for screening for other viral diseases: Secondary | ICD-10-CM

## 2021-04-10 DIAGNOSIS — Z79899 Other long term (current) drug therapy: Secondary | ICD-10-CM

## 2021-04-10 DIAGNOSIS — Z0001 Encounter for general adult medical examination with abnormal findings: Secondary | ICD-10-CM | POA: Diagnosis not present

## 2021-04-10 DIAGNOSIS — F325 Major depressive disorder, single episode, in full remission: Secondary | ICD-10-CM | POA: Diagnosis not present

## 2021-04-10 LAB — LIPID PANEL
Cholesterol: 239 mg/dL — ABNORMAL HIGH (ref 0–200)
HDL: 55.5 mg/dL (ref >39.00)
LDL Cholesterol: 160 mg/dL — ABNORMAL HIGH (ref 0–99)
NonHDL: 183.41
Total CHOL/HDL Ratio: 4
Triglycerides: 118 mg/dL (ref 0.0–149.0)
VLDL: 23.6 mg/dL (ref 0.0–40.0)

## 2021-04-10 LAB — HEPATIC FUNCTION PANEL
ALT: 9 U/L (ref 0–35)
AST: 12 U/L (ref 0–37)
Albumin: 4.5 g/dL (ref 3.5–5.2)
Alkaline Phosphatase: 48 U/L (ref 39–117)
Bilirubin, Direct: 0 mg/dL (ref 0.0–0.3)
Total Bilirubin: 0.3 mg/dL (ref 0.2–1.2)
Total Protein: 6.9 g/dL (ref 6.0–8.3)

## 2021-04-10 LAB — CBC WITH DIFFERENTIAL/PLATELET
Basophils Absolute: 0.1 10*3/uL (ref 0.0–0.1)
Basophils Relative: 2.7 % (ref 0.0–3.0)
Eosinophils Absolute: 0.5 10*3/uL (ref 0.0–0.7)
Eosinophils Relative: 9.3 % — ABNORMAL HIGH (ref 0.0–5.0)
HCT: 40.8 % (ref 36.0–46.0)
Hemoglobin: 13.5 g/dL (ref 12.0–15.0)
Lymphocytes Relative: 36.1 % (ref 12.0–46.0)
Lymphs Abs: 1.9 10*3/uL (ref 0.7–4.0)
MCHC: 33.2 g/dL (ref 30.0–36.0)
MCV: 89.3 fl (ref 78.0–100.0)
Monocytes Absolute: 0.4 10*3/uL (ref 0.1–1.0)
Monocytes Relative: 7.1 % (ref 3.0–12.0)
Neutro Abs: 2.4 10*3/uL (ref 1.4–7.7)
Neutrophils Relative %: 44.8 % (ref 43.0–77.0)
Platelets: 263 10*3/uL (ref 150.0–400.0)
RBC: 4.57 Mil/uL (ref 3.87–5.11)
RDW: 12.9 % (ref 11.5–15.5)
WBC: 5.3 10*3/uL (ref 4.0–10.5)

## 2021-04-10 LAB — HEPATITIS C ANTIBODY
Hepatitis C Ab: NONREACTIVE
SIGNAL TO CUT-OFF: 0.07 (ref <1.00)

## 2021-04-10 LAB — BASIC METABOLIC PANEL
BUN: 13 mg/dL (ref 6–23)
CO2: 23 mEq/L (ref 19–32)
Calcium: 9.4 mg/dL (ref 8.4–10.5)
Chloride: 110 mEq/L (ref 96–112)
Creatinine, Ser: 0.97 mg/dL (ref 0.40–1.20)
GFR: 71.17 mL/min (ref >60.00)
Glucose, Bld: 95 mg/dL (ref 70–99)
Potassium: 4.4 mEq/L (ref 3.5–5.1)
Sodium: 139 mEq/L (ref 135–145)

## 2021-04-10 LAB — TSH: TSH: 2.14 u[IU]/mL (ref 0.35–5.50)

## 2021-04-10 LAB — HIV ANTIBODY (ROUTINE TESTING W REFLEX): HIV 1&2 Ab, 4th Generation: NONREACTIVE

## 2021-04-21 ENCOUNTER — Other Ambulatory Visit: Payer: Self-pay

## 2021-04-21 ENCOUNTER — Ambulatory Visit (INDEPENDENT_AMBULATORY_CARE_PROVIDER_SITE_OTHER): Payer: BLUE CROSS/BLUE SHIELD | Admitting: Internal Medicine

## 2021-04-21 ENCOUNTER — Encounter: Payer: Self-pay | Admitting: Internal Medicine

## 2021-04-21 VITALS — BP 132/86 | HR 79 | Temp 98.4°F | Resp 16 | Ht 63.0 in | Wt 124.0 lb

## 2021-04-21 DIAGNOSIS — L989 Disorder of the skin and subcutaneous tissue, unspecified: Secondary | ICD-10-CM | POA: Insufficient documentation

## 2021-04-21 DIAGNOSIS — E538 Deficiency of other specified B group vitamins: Secondary | ICD-10-CM | POA: Insufficient documentation

## 2021-04-21 DIAGNOSIS — G320 Subacute combined degeneration of spinal cord in diseases classified elsewhere: Secondary | ICD-10-CM | POA: Diagnosis not present

## 2021-04-21 DIAGNOSIS — Z0001 Encounter for general adult medical examination with abnormal findings: Secondary | ICD-10-CM

## 2021-04-21 DIAGNOSIS — Z1231 Encounter for screening mammogram for malignant neoplasm of breast: Secondary | ICD-10-CM | POA: Insufficient documentation

## 2021-04-21 DIAGNOSIS — Z1211 Encounter for screening for malignant neoplasm of colon: Secondary | ICD-10-CM | POA: Insufficient documentation

## 2021-04-21 DIAGNOSIS — Z124 Encounter for screening for malignant neoplasm of cervix: Secondary | ICD-10-CM

## 2021-04-21 DIAGNOSIS — F324 Major depressive disorder, single episode, in partial remission: Secondary | ICD-10-CM

## 2021-04-21 MED ORDER — BUPROPION HCL ER (XL) 150 MG PO TB24: 150.0000 mg | ORAL_TABLET | Freq: Every day | ORAL | 0 refills | Status: DC

## 2021-04-21 MED ORDER — CYANOCOBALAMIN 1000 MCG/ML IJ SOLN
1000.0000 ug | Freq: Once | INTRAMUSCULAR | Status: AC
  Administered 2021-04-21: 1000 ug via INTRAMUSCULAR

## 2021-04-21 NOTE — Progress Notes (Signed)
Subjective:  Patient ID: Vanessa Reese, female    DOB: August 28, 1975  Age: 45 y.o. MRN: [id number]  CC: Annual Exam and Depression  This visit occurred during the SARS-CoV-2 public health emergency.  Safety protocols were in place, including screening questions prior to the visit, additional usage of staff PPE, and extensive cleaning of exam room while observing appropriate contact time as indicated for disinfecting solutions.    HPI [name] presents for a CPX and f/up -   She has a lesion just outside her left nares that she wants removed. She is having LB surgery at [facility] tomorrow. She wants to increase the wellbutrin dose.  Outpatient Medications Prior to Visit  Medication Sig Dispense Refill   buPROPion (WELLBUTRIN XL) 300 MG 24 hr tablet Take 1 tablet (300 mg total) by mouth in the morning. 90 tablet 1   gabapentin (NEURONTIN) 600 MG tablet Take 600 mg by mouth at bedtime.      sertraline (ZOLOFT) 100 MG tablet Take 2 tablets (200 mg total) by mouth daily. 180 tablet 1   topiramate (TOPAMAX) 200 MG tablet Take 1 tablet (200 mg total) by mouth 2 (two) times daily. 90 tablet 1   No facility-administered medications prior to visit.    ROS Review of Systems  Constitutional:  Negative for diaphoresis and fatigue.  HENT: Negative.    Eyes: Negative.   Respiratory:  Negative for cough, chest tightness, shortness of breath and wheezing.   Cardiovascular:  Negative for chest pain, palpitations and leg swelling.  Gastrointestinal:  Negative for abdominal pain, diarrhea, nausea and vomiting.  Endocrine: Negative.   Genitourinary: Negative.  Negative for difficulty urinating and dysuria.  Musculoskeletal:  Positive for back pain and gait problem. Negative for arthralgias and myalgias.  Skin: Negative.  Negative for color change and pallor.  Neurological:  Positive for weakness and numbness. Negative for dizziness, seizures, light-headedness and headaches.  Hematological:  Negative  for adenopathy. Does not bruise/bleed easily.  Psychiatric/Behavioral:  Positive for dysphoric mood. Negative for agitation, behavioral problems, confusion, decreased concentration, self-injury and suicidal ideas. The patient is not nervous/anxious.    Objective:  BP 132/86 (BP Location: Right Arm, Patient Position: Sitting, Cuff Size: Large)   Pulse 79   Temp 98.4 F (36.9 C) (Oral)   Resp 16   Ht 5\' 3"  (1.6 m)   Wt 124 lb (56.2 kg)   SpO2 98%   BMI 21.97 kg/m   BP Readings from Last 3 Encounters:  04/21/21 132/86  02/20/21 124/78  12/29/19 122/78    Wt Readings from Last 3 Encounters:  04/21/21 124 lb (56.2 kg)  02/20/21 122 lb (55.3 kg)  12/10/19 120 lb (54.4 kg)    Physical Exam Vitals reviewed.  Constitutional:      Appearance: Normal appearance.  HENT:     Nose: Nose normal.      Mouth/Throat:     Mouth: Mucous membranes are moist.  Eyes:     General: No scleral icterus.    Conjunctiva/sclera: Conjunctivae normal.  Cardiovascular:     Rate and Rhythm: Normal rate and regular rhythm.     Heart sounds: No murmur heard. Pulmonary:     Effort: Pulmonary effort is normal.     Breath sounds: No stridor. No wheezing, rhonchi or rales.  Abdominal:     General: Abdomen is flat.     Palpations: There is no mass.     Tenderness: There is no abdominal tenderness. There is no right CVA tenderness  or guarding.     Hernia: No hernia is present.  Musculoskeletal:        General: Normal range of motion.     Cervical back: Neck supple.     Right lower leg: No edema.     Left lower leg: No edema.  Lymphadenopathy:     Cervical: No cervical adenopathy.  Skin:    General: Skin is warm.  Neurological:     General: No focal deficit present.     Mental Status: She is alert. Mental status is at baseline.  Psychiatric:        Mood and Affect: Mood normal.        Behavior: Behavior normal.    Lab Results  Component Value Date   WBC 5.3 04/10/2021   HGB 13.5 04/10/2021    HCT 40.8 04/10/2021   PLT 263.0 04/10/2021   GLUCOSE 95 04/10/2021   CHOL 239 (H) 04/10/2021   TRIG 118.0 04/10/2021   HDL 55.50 04/10/2021   LDLCALC 160 (H) 04/10/2021   ALT 9 04/10/2021   AST 12 04/10/2021   NA 139 04/10/2021   K 4.4 04/10/2021   CL 110 04/10/2021   CREATININE 0.97 04/10/2021   BUN 13 04/10/2021   CO2 23 04/10/2021   TSH 2.14 04/10/2021    MR LUMBAR SPINE WO CONTRAST  Result Date: 12/29/2019 CLINICAL DATA:  Low back pain, progressive neurologic deficit. Additional history provided: Patient reports back pain and right leg pain, fall 3 weeks ago with "fractured tailbone." Fall today. Patient unable to ambulate. EXAM: MRI LUMBAR SPINE WITHOUT CONTRAST TECHNIQUE: Multiplanar, multisequence MR imaging of the lumbar spine was performed. No intravenous contrast was administered. COMPARISON:  CT of the lumbar spine 12/10/2019. FINDINGS: Segmentation: For the purposes of this dictation, five lumbar vertebrae are assumed and the caudal most well-formed intervertebral disc is designated L5-S1. Alignment: Straightening of the expected lumbar lordosis. Mild L4-L5 grade 1 retrolisthesis Vertebrae: Vertebral body height is maintained. No marrow edema or suspicious osseous lesion. Conus medullaris and cauda equina: Conus extends to the L2 level. No signal abnormality within the visualized distal spinal cord. Paraspinal and other soft tissues: No abnormality identified within included portions of the abdomen/retroperitoneum. Paraspinal soft tissues within normal limits. Disc levels: Mild L4-L5 disc degeneration. 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 T12-L1: No significant disc herniation or stenosis. L1-L2: No significant disc herniation or stenosis. L2-L3: No significant disc herniation or  stenosis. L3-L4: Mild facet hypertrophy. No significant disc herniation or stenosis. L4-L5: Disc bulge. Central posterior annular fissure. Mild facet/ligamentum flavum hypertrophy. There is mild bilateral subarticular narrowing without frank nerve root impingement. Central canal patent. No significant foraminal stenosis. L5-S1: Mild facet hypertrophy. No significant disc herniation or stenosis. 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 Lumbar spondylosis as outlined and most notably as follows. At L4-L5, there is mild disc degeneration. Disc bulge with central posterior annular fissure. Mild facet/ligamentum flavum hypertrophy. Mild bilateral subarticular narrowing without frank nerve root impingement. No significant central canal or foraminal stenosis. Please refer to MRI of the sacrum/pelvis separately reported for additional findings. Electronically Signed   By: Kellie Simmering DO   On:  12/29/2019 15:38   MR SACRUM SI JOINTS WO CONTRAST  Addendum Date: 12/29/2019   ADDENDUM REPORT: 12/29/2019 15:34 ADDENDUM: Large sacral Tarlov cyst with anterior displacement of the sacral nerve roots. 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 Electronically Signed   By: Kathreen Devoid   On: 12/29/2019 15:34   Result Date: 12/29/2019 CLINICAL DATA:  Low back pain status post fall, right leg pain. EXAM: MRI SACRUM WITHOUT CONTRAST TECHNIQUE: Multiplanar, multisequence MR imaging of the sacrum was performed. No intravenous contrast was administered. COMPARISON:  None. FINDINGS: Bones/Joint/Cartilage Focal marrow edema in the inferior right and left sacral ala with a subtle linear component concerning for nondisplaced fractures. Fractures are in close proximity to the S3 and S4 neural foramina. No other fracture or  subluxation. Sacroiliac joints are normal without widening or erosive changes. No SI joint effusion. Normal alignment. No joint effusion. Broad-based disc bulge at L4-5 with a central annular fissure. This is better evaluated on the MRI of the lumbar spine performed the same day and reported separately. Large sacral Tarlov cyst with scalloping of the posterior margin of the S2 and S3 vertebral bodies. Muscles and Tendons Muscles are normal.  No muscle atrophy. Soft tissue Small amount of pelvic free fluid. No fluid collection or hematoma. Visualized uterus demonstrates no focal abnormality. No soft tissue mass. IMPRESSION: 1. Focal marrow edema in the inferior right and left sacral ala with a subtle linear component concerning for nondisplaced fractures. Electronically Signed: By: Kathreen Devoid On: 12/29/2019 15:22    Assessment & Plan:   Dereon was seen today for annual exam and depression.  Diagnoses and all orders for this visit:  Skin lesion of face -     Ambulatory referral to Dermatology  Major depressive disorder with single episode, in partial remission (HCC) -     buPROPion (WELLBUTRIN XL) 150 MG 24 hr tablet; Take 1 tablet (150 mg total) by mouth daily.  Encounter for general adult medical examination with abnormal findings- Exam completed, labs reviewed, vaccines reviewed, cancer screenings addressed, pt ed material was given.  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 I am also having her maintain her gabapentin, buPROPion, topiramate, and sertraline. We administered cyanocobalamin.  Meds ordered this encounter  Medications   buPROPion (WELLBUTRIN XL) 150 MG 24 hr tablet    Sig: Take 1 tablet (150 mg total) by  mouth daily.    Dispense:  90 tablet    Refill:  0   cyanocobalamin ((VITAMIN B-12)) injection 1,000 mcg      Follow-up: Return in about 3 months (around 07/22/2021).  Scarlette Calico, MD

## 2021-04-21 NOTE — Patient Instructions (Signed)

## 2021-05-27 ENCOUNTER — Other Ambulatory Visit: Payer: Self-pay | Admitting: Internal Medicine

## 2021-05-27 DIAGNOSIS — F324 Major depressive disorder, single episode, in partial remission: Secondary | ICD-10-CM

## 2021-06-04 ENCOUNTER — Ambulatory Visit (INDEPENDENT_AMBULATORY_CARE_PROVIDER_SITE_OTHER): Payer: Medicaid Other | Admitting: Obstetrics and Gynecology

## 2021-06-04 ENCOUNTER — Encounter: Payer: Self-pay | Admitting: Obstetrics and Gynecology

## 2021-06-04 ENCOUNTER — Other Ambulatory Visit (HOSPITAL_COMMUNITY)
Admission: RE | Admit: 2021-06-04 | Discharge: 2021-06-04 | Disposition: A | Discharge status: Home / Self Care | Payer: 59 | Source: Ambulatory Visit | Attending: Obstetrics and Gynecology | Admitting: Obstetrics and Gynecology

## 2021-06-04 ENCOUNTER — Other Ambulatory Visit: Payer: Self-pay

## 2021-06-04 VITALS — BP 107/72 | HR 77 | Ht 62.0 in | Wt 120.0 lb

## 2021-06-04 DIAGNOSIS — Z01419 Encounter for gynecological examination (general) (routine) without abnormal findings: Secondary | ICD-10-CM | POA: Insufficient documentation

## 2021-06-04 DIAGNOSIS — N182 Chronic kidney disease, stage 2 (mild): Secondary | ICD-10-CM | POA: Insufficient documentation

## 2021-06-04 DIAGNOSIS — Z30430 Encounter for insertion of intrauterine contraceptive device: Secondary | ICD-10-CM | POA: Insufficient documentation

## 2021-06-04 DIAGNOSIS — N946 Dysmenorrhea, unspecified: Secondary | ICD-10-CM

## 2021-06-04 MED ORDER — LEVONORGESTREL 20.1 MCG/DAY IU IUD
1.0000 | INTRAUTERINE_SYSTEM | Freq: Once | INTRAUTERINE | Status: AC
  Administered 2021-06-04: 12:00:00 1 via INTRAUTERINE

## 2021-06-04 NOTE — Progress Notes (Addendum)
NEW GYN Pt presents for heavy bleeding and pain 10/10 x 10+ years.  Patient wants an Ablation.  Mammogram is scheduled for 06/05/21  Administrations This Visit     levonorgestrel (LILETTA) 20.1 MCG/DAY IUD 1 each     Admin Date 06/04/2021 Action Given Dose 1 each Route Intrauterine Administered By Tamela Oddi, RMA

## 2021-06-04 NOTE — Progress Notes (Signed)
GYNECOLOGY ANNUAL PREVENTATIVE CARE ENCOUNTER NOTE  History:     Vanessa Reese is a 45 y.o. G28 female here for a routine annual gynecologic exam.    Current complaints: The pt has painful periods and has wanted an ablation for years. The pt has had an [place] and MRI in the last 3-4 years which show anatomically normal uterus. Pt has had painful periods since the pt was a teen. Pt has tried OCPs and Nexplanon in the past. Pt didn't like how OCPs made the pt feel and the Nexplanon progesterone also was too much for the pt. Upon further discussion her main issue with periods is the pain.   Vanessa Reese's sister had an ablation and has had amenorrhea on it.   The pt is sexually active with women and has no concern for pregnancy.      Gynecologic History No LMP recorded. Contraception:  same sex relationships Last Pap: None on file or in care everywhere Last Mammogram: scheduled for tomorrow Last Colonoscopy: not yet due  Obstetric History OB History  Gravida Para Term Preterm AB Living  0 0 0 0 0 0  SAB IAB Ectopic Multiple Live Births  0 0 0 0 0    Past Medical History:  Diagnosis Date   Bilateral ovarian cysts    Chronic kidney disease    Migraine    Vaginal Pap smear, abnormal     Past Surgical History:  Procedure Laterality Date   OVARIAN CYST REMOVAL     TONSILLECTOMY      Current Outpatient Medications on File Prior to Visit  Medication Sig Dispense Refill   oxyCODONE (OXY IR/ROXICODONE) 5 MG immediate release tablet Take by mouth.     buPROPion (WELLBUTRIN XL) 150 MG 24 hr tablet Take 1 tablet by mouth once daily 90 tablet 0   buPROPion (WELLBUTRIN XL) 300 MG 24 hr tablet Take 1 tablet (300 mg total) by mouth in the morning. 90 tablet 1   enoxaparin (LOVENOX) 40 MG/0.4ML injection Inject 40 mg into the skin daily.     gabapentin (NEURONTIN) 600 MG tablet Take 600 mg by mouth at bedtime.      HYDROmorphone (DILAUDID) 2 MG tablet Take 2 mg by mouth every 4 (four)  hours as needed.     ondansetron (ZOFRAN) 4 MG tablet Take 4 mg by mouth every 8 (eight) hours as needed.     sertraline (ZOLOFT) 100 MG tablet Take 2 tablets (200 mg total) by mouth daily. 180 tablet 1   topiramate (TOPAMAX) 200 MG tablet Take 1 tablet (200 mg total) by mouth 2 (two) times daily. 90 tablet 1   No current facility-administered medications on file prior to visit.    Allergies  Allergen Reactions   Imitrex [Sumatriptan] Other (See Comments)    Migraine gets worse.     Social History:  reports that she has quit smoking. She has never used smokeless tobacco. She reports that she does not currently use alcohol. She reports that she does not use drugs.  History reviewed. No pertinent family history.  The following portions of the patient's history were reviewed and updated as appropriate: allergies, current medications, past family history, past medical history, past social history, past surgical history and problem list.  Review of Systems Pertinent items noted in HPI and remainder of comprehensive ROS otherwise negative.  Physical Exam:  BP 107/72    Pulse 77    Ht 5\' 2"  (1.575 m)    Wt 120 lb (  54.4 kg)    BMI 21.95 kg/m  CONSTITUTIONAL: Well-developed, well-nourished female in no acute distress.  HENT:  Normocephalic, atraumatic, External right and left ear normal.  EYES: Conjunctivae and EOM are normal. Pupils are equal, round, and reactive to light. No scleral icterus.  NECK: Normal range of motion, supple, no masses.  Normal thyroid.  SKIN: Skin is warm and dry. No rash noted. Not diaphoretic. No erythema. No pallor. MUSCULOSKELETAL: Normal range of motion. No tenderness.  No cyanosis, clubbing, or edema. NEUROLOGIC: Alert and oriented to person, place, and time. Normal reflexes, muscle tone coordination.  PSYCHIATRIC: Normal mood and affect. Normal behavior. Normal judgment and thought content.  CARDIOVASCULAR: Normal heart rate noted, regular rhythm RESPIRATORY:  Clear to auscultation bilaterally. Effort and breath sounds normal, no problems with respiration noted.  BREASTS: Symmetric in size. No masses, tenderness, skin changes, nipple drainage, or lymphadenopathy bilaterally. Performed in the presence of a chaperone. ABDOMEN: Soft, no distention noted.  No tenderness, rebound or guarding.  PELVIC: External genitalia normal, Vagina normal without discharge, Urethra without abnormality or discharge, no bladder tenderness, cervix normal in appearance, no CMT, uterus normal size, shape, and consistency, no adnexal masses or tenderness. Performed in the presence of a chaperone.  12/2019: MRI notes retroflexed uterus without other anatomic abnormalities - report reviewed in care everywhere   GYNECOLOGY OFFICE PROCEDURE NOTE  Vanessa Reese is a 45 y.o. G0P0000 here for IUD insertion. No GYN concerns.  Last pap smear was done today  IUD Insertion Procedure Note Procedure: IUD insertion with Liletta UPT:  not indicated  Patient identified.  Risks, benefits and alternatives of procedure were discussed including irregular bleeding, cramping, infection, malpositioning or misplacement of the IUD outside the uterus which may require further procedure such as laparoscopy. Also discussed >99% contraception efficacy, increased risk of ectopic pregnancy with failure of method.   Emphasized that this did not protect against STIs, condoms recommended during all sexual encounters. Consent signed. Time out performed.   Speculum inserted and cervix visualized, prepped with 3 swabs of betadine. An intracervical block was done with a total of 3 cc of 1% lidocaine in total.   Grasped with a single tooth tenaculum, Uterus sounded to 8 cm, Cervical dilators used to a Pratt of 19, and IUD then inserted without difficulty per manufacturer's instructions and strings cut to 3 cm below cervical os and all instruments removed. Pt tolerated well with minimal pain and bleeding.    Discussed concerning signs/symptoms and to call if heavy bleeding, severe abdominal pain, or fever in the following 3 weeks. Manufacturer pamphlet/patient information given. Reviewed timing of efficacy for contraception and to use an alternative form of birth control until that time.   Assessment and Plan:    Vanessa Reese was seen today for gynecologic exam.  Diagnoses and all orders for this visit:  Encounter for annual routine gynecological examination - Cervical cancer screening: Discussed guidelines. Pap with HPV done today - STD Testing: declines - Birth Control: Same sex partnering, not indicated for this purpose but IUD for dysmenorrhea placed today - Breast Health: Encouraged self breast awareness/SBE. Teaching provided. Discussed limits of clinical breast exam for detecting breast cancer. MXR scheduled for tomorrow - F/U 12 months and prn   Dysmenorrhea - Discussed differential: i.e. secondary dysmenorrhea vs endometriosis vs other etiology (also possible is adenomyosis) - Discussed only way to tell definitively is either by US showing an endometrioma or by laparoscopy. However most diagnoses are made clinical.  - Discussed treatment options:  Motrin prn severe cramps along with tylenol, OCPs, Nuvaring, Depo, Nexplanon, Mirena and other hormonal options i.e. Edward Qualia. She has done OCPs and Nexplanon in the past and is not interested in this.  - We also discussed surgical options as well as expectant management. Discussed surgery options of ablation and hysterectomy. We both agreed hysterectomy would not be the next step that would be recommended. We discussed the use of ablation in s/o ablation and that generally not effective at treating painful periods. We discussed primary indication would be for the heaviness of the periods. We discussed risks associated with ablation I.e. bleeding resuming, ineffective at treating pain, limiting postoperative options I.e. unable to place IUD, and  risk of needing a hysterectomy. We did discussed satisfaction rates with ablation as well.  - At this time, she would like to do: Hormonal therapy with IUD (LNG)   Encounter for IUD insertion - Discussed the risk, benefits, and alternatives to an IUD.  Risks include but are not limited to: bleeding, infection, uterine perforation with misplacement of the IUD, expulsion (1%), changes to bleeding patterns with some women experiencing amenorrhea.  - Consent reviewed and signed. See procedure note for details. No complications. Discussed precautions and concerning signs/symptoms afterward - i.e. symptoms of expulsion, fever, etc.  - Reminder card given and information pamphlet given.   Other orders -     levonorgestrel (LILETTA) 20.1 MCG/DAY IUD 1 each  Routine preventative health maintenance measures emphasized. Please refer to After Visit Summary for other counseling recommendations.   Radene Gunning, MD, [place] for [facility], [place]

## 2021-06-05 ENCOUNTER — Ambulatory Visit
Admission: RE | Admit: 2021-06-05 | Discharge: 2021-06-05 | Disposition: A | Discharge status: Home / Self Care | Payer: 59 | Source: Ambulatory Visit | Attending: Internal Medicine | Admitting: Internal Medicine

## 2021-06-05 ENCOUNTER — Other Ambulatory Visit: Payer: Self-pay | Admitting: Internal Medicine

## 2021-06-05 DIAGNOSIS — F324 Major depressive disorder, single episode, in partial remission: Secondary | ICD-10-CM

## 2021-06-05 DIAGNOSIS — Z1231 Encounter for screening mammogram for malignant neoplasm of breast: Secondary | ICD-10-CM

## 2021-06-05 IMAGING — MG MM DIGITAL SCREENING BILAT W/ TOMO AND CAD
8 series · 9 of 24 positions shown · non-contrast
Comparison: None.

CLINICAL DATA: Screening.

EXAM:
DIGITAL SCREENING BILATERAL MAMMOGRAM WITH TOMOSYNTHESIS AND CAD
TECHNIQUE: Bilateral screening digital craniocaudal and mediolateral oblique
mammograms were obtained. Bilateral screening digital breast
tomosynthesis was performed. The images were evaluated with
computer-aided detection.

[R CC synth-2D]
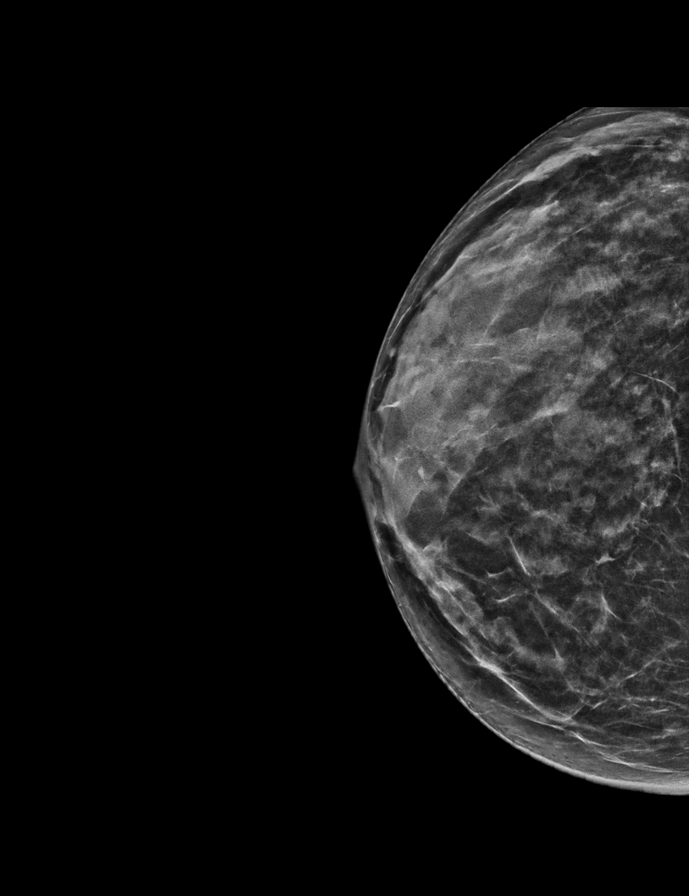

[L CC synth-2D]
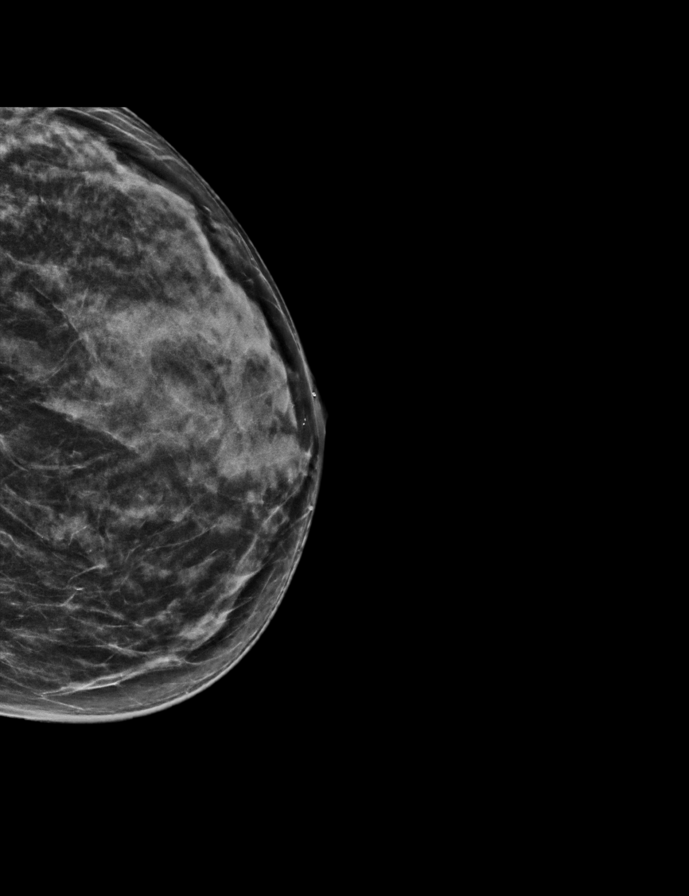

[R MLO synth-2D]
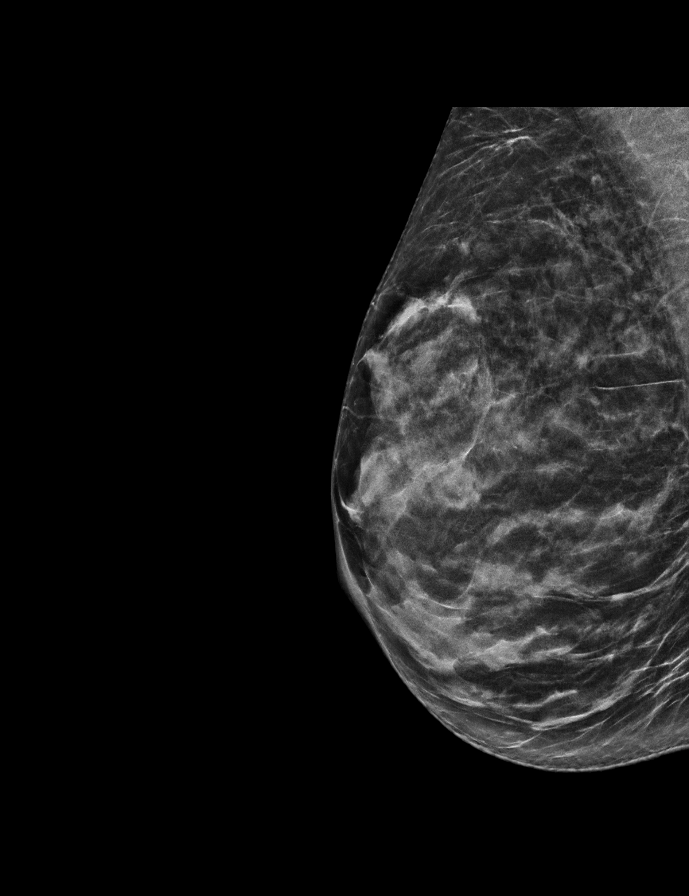

[L MLO synth-2D]
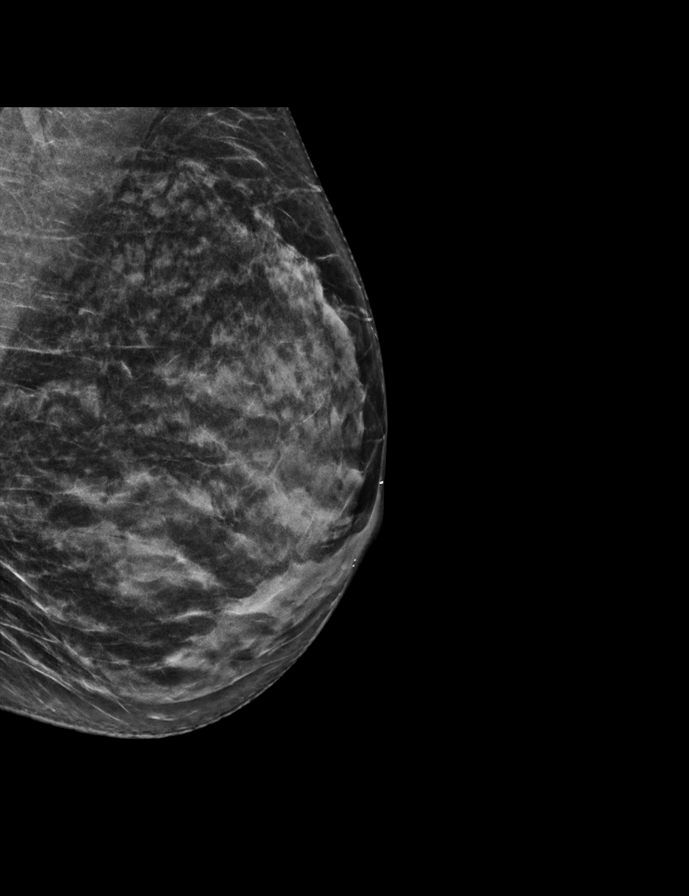

[L CC tomo · 2 of 57 frames shown]
[frame 19/57]
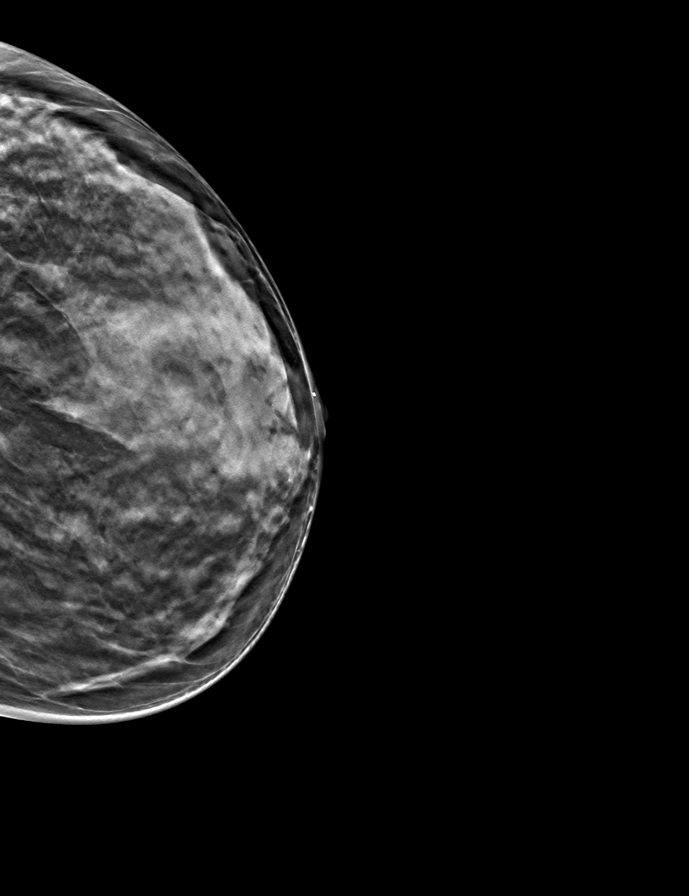
[frame 29/57]
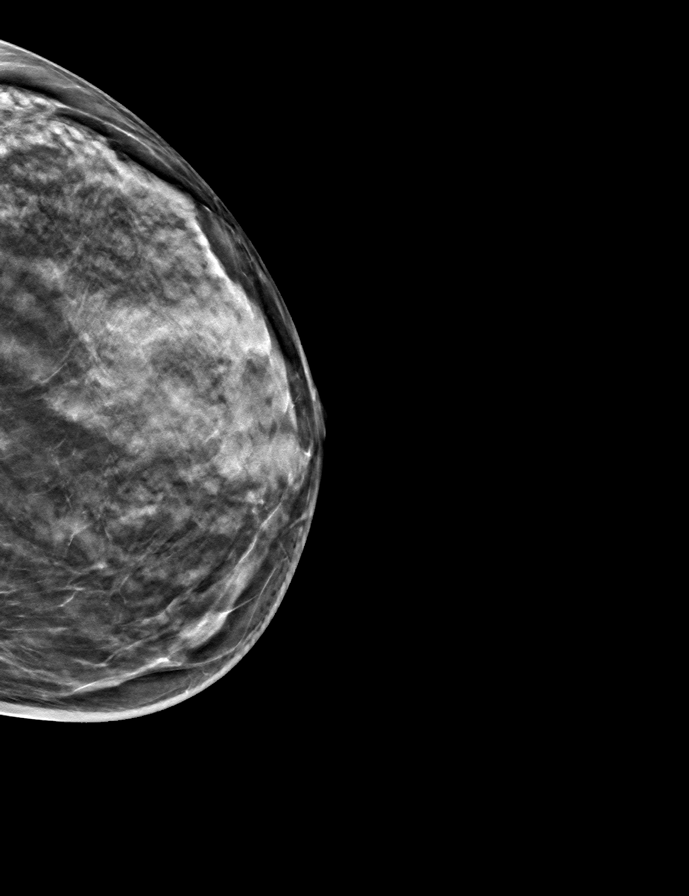

[R CC tomo · tomo slice 26/51.0]
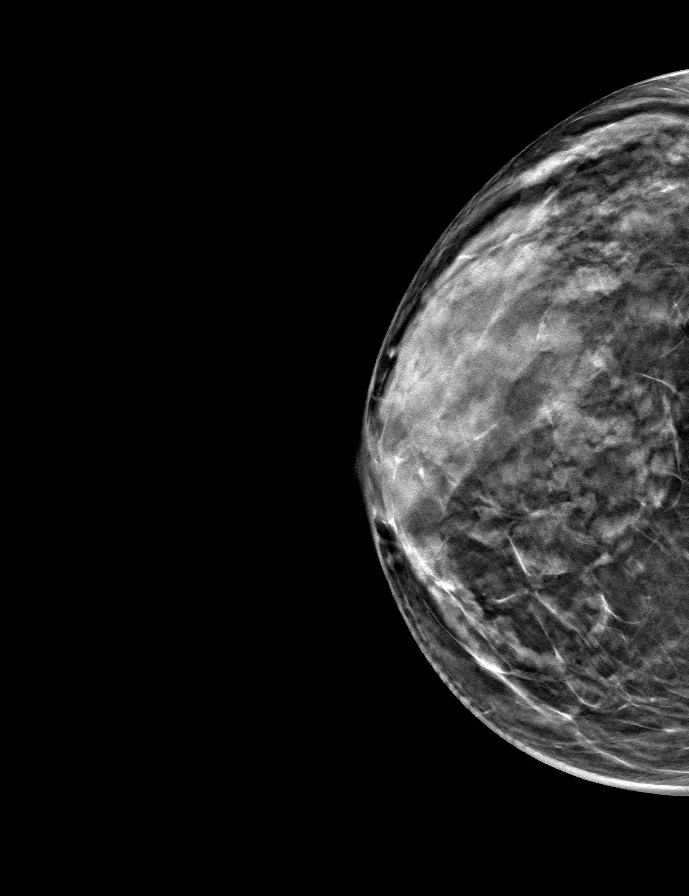

[L MLO tomo · tomo slice 29/57.0]
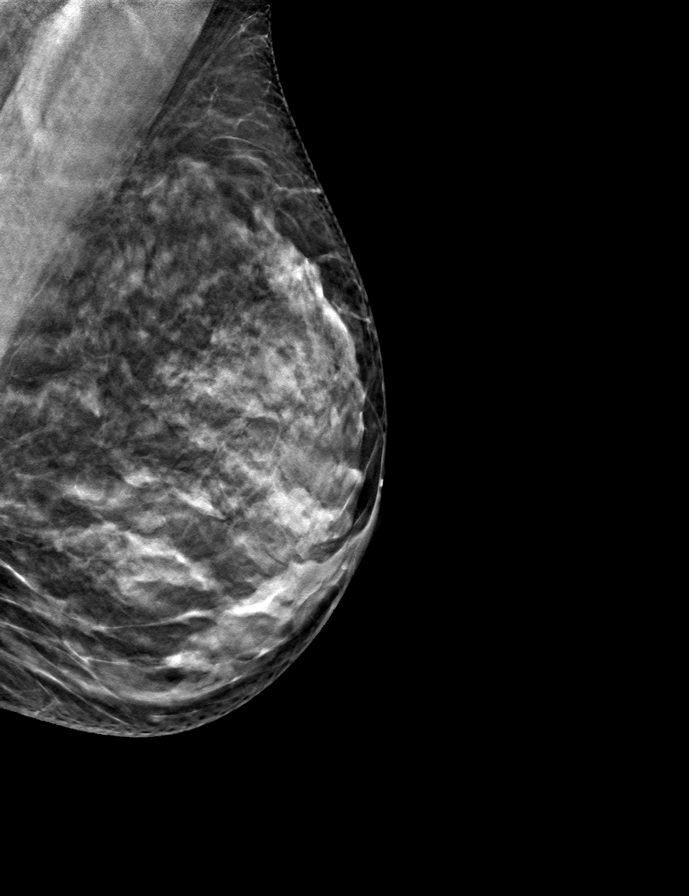

[R MLO tomo · tomo slice 27/54.0]
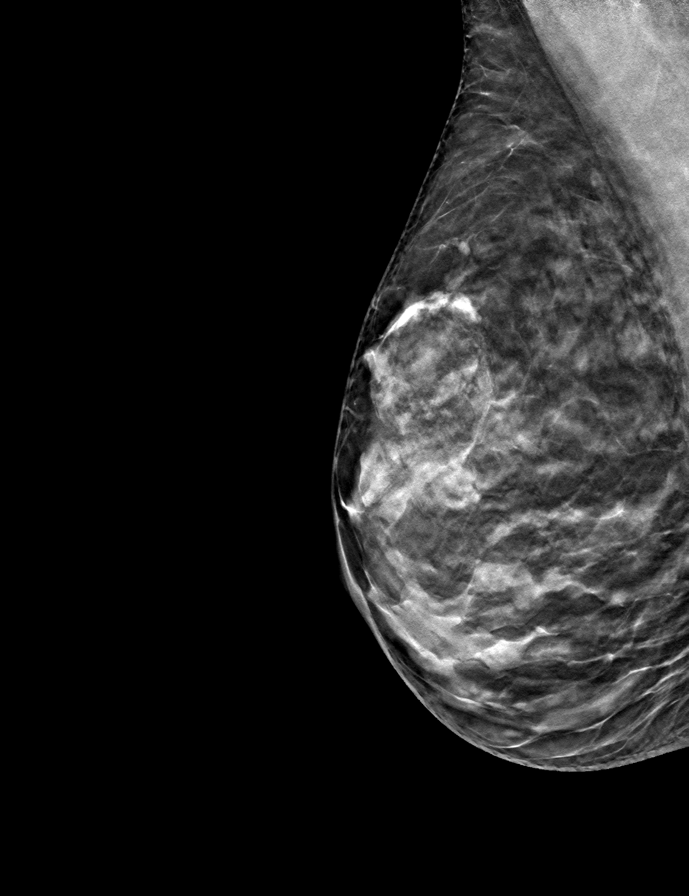

[9 of 24 positions shown; findings below may reference images not displayed]

ACR Breast Density Category c: The breast tissue is heterogeneously
dense, which may obscure small masses
FINDINGS: There are no findings suspicious for malignancy.
IMPRESSION: No mammographic evidence of malignancy. A result letter of this
screening mammogram will be mailed directly to the patient.

RECOMMENDATION:
Screening mammogram in one year. (Code:[6Z])

BI-RADS CATEGORY  1: Negative.

## 2021-06-06 LAB — CYTOLOGY - PAP
Comment: NEGATIVE
Diagnosis: NEGATIVE
High risk HPV: NEGATIVE

## 2021-06-10 ENCOUNTER — Encounter: Payer: Self-pay | Admitting: Obstetrics and Gynecology

## 2021-06-12 ENCOUNTER — Other Ambulatory Visit: Payer: Self-pay

## 2021-06-12 DIAGNOSIS — Z8742 Personal history of other diseases of the female genital tract: Secondary | ICD-10-CM | POA: Diagnosis not present

## 2021-06-12 DIAGNOSIS — B379 Candidiasis, unspecified: Secondary | ICD-10-CM

## 2021-06-12 DIAGNOSIS — R102 Pelvic and perineal pain: Secondary | ICD-10-CM | POA: Diagnosis not present

## 2021-06-12 MED ORDER — FLUCONAZOLE 150 MG PO TABS: 150.0000 mg | ORAL_TABLET | Freq: Once | ORAL | 0 refills | Status: AC

## 2021-06-17 ENCOUNTER — Ambulatory Visit (INDEPENDENT_AMBULATORY_CARE_PROVIDER_SITE_OTHER): Payer: Medicaid Other | Admitting: Obstetrics and Gynecology

## 2021-06-17 ENCOUNTER — Encounter: Payer: Self-pay | Admitting: Obstetrics and Gynecology

## 2021-06-17 ENCOUNTER — Other Ambulatory Visit: Payer: Self-pay

## 2021-06-17 VITALS — BP 139/89 | HR 95 | Ht 62.0 in | Wt 120.0 lb

## 2021-06-17 DIAGNOSIS — Z30432 Encounter for removal of intrauterine contraceptive device: Secondary | ICD-10-CM | POA: Diagnosis not present

## 2021-06-17 NOTE — Progress Notes (Signed)
° ° °  GYNECOLOGY OFFICE PROCEDURE NOTE  Vanessa Reese is a 46 y.o. G0P0000 here for IUD removal due to significant discomfort - pain and bleeding since insertion that has been severe pain in nature.   IUD Removal  Patient identified, informed consent performed, consent signed.  Patient was in the dorsal lithotomy position, normal external genitalia was noted.  A speculum was placed in the patient's vagina, normal discharge was noted, no lesions. The cervix was visualized, no lesions, no abnormal discharge.  The strings of the IUD were grasped and pulled using ring forceps. The IUD was removed in its entirety.   Patient tolerated the procedure well.    Patient  will consider options for her painful periods. We generally discussed expectant management, inducing menopause with medicines, and hormonal therapy that is progesterone only.   Routine preventative health maintenance measures emphasized.   Radene Gunning, MD, [place] for [facility], [place]

## 2021-06-19 DIAGNOSIS — N946 Dysmenorrhea, unspecified: Secondary | ICD-10-CM | POA: Diagnosis not present

## 2021-06-19 DIAGNOSIS — R102 Pelvic and perineal pain: Secondary | ICD-10-CM | POA: Diagnosis not present

## 2021-06-19 DIAGNOSIS — N920 Excessive and frequent menstruation with regular cycle: Secondary | ICD-10-CM | POA: Diagnosis not present

## 2021-06-19 DIAGNOSIS — M7918 Myalgia, other site: Secondary | ICD-10-CM | POA: Diagnosis not present

## 2021-06-24 DIAGNOSIS — S3210XD Unspecified fracture of sacrum, subsequent encounter for fracture with routine healing: Secondary | ICD-10-CM | POA: Diagnosis not present

## 2021-06-24 DIAGNOSIS — X58XXXD Exposure to other specified factors, subsequent encounter: Secondary | ICD-10-CM | POA: Diagnosis not present

## 2021-06-24 DIAGNOSIS — Z79899 Other long term (current) drug therapy: Secondary | ICD-10-CM | POA: Diagnosis not present

## 2021-06-24 DIAGNOSIS — W1839XD Other fall on same level, subsequent encounter: Secondary | ICD-10-CM | POA: Diagnosis not present

## 2021-06-24 DIAGNOSIS — Z87891 Personal history of nicotine dependence: Secondary | ICD-10-CM | POA: Diagnosis not present

## 2021-07-03 DIAGNOSIS — M7918 Myalgia, other site: Secondary | ICD-10-CM | POA: Diagnosis not present

## 2021-07-03 DIAGNOSIS — N946 Dysmenorrhea, unspecified: Secondary | ICD-10-CM | POA: Diagnosis not present

## 2021-07-03 DIAGNOSIS — R102 Pelvic and perineal pain: Secondary | ICD-10-CM | POA: Diagnosis not present

## 2021-07-08 DIAGNOSIS — R69 Illness, unspecified: Secondary | ICD-10-CM | POA: Diagnosis not present

## 2021-07-14 DIAGNOSIS — N946 Dysmenorrhea, unspecified: Secondary | ICD-10-CM | POA: Diagnosis not present

## 2021-07-14 DIAGNOSIS — M7918 Myalgia, other site: Secondary | ICD-10-CM | POA: Diagnosis not present

## 2021-07-14 DIAGNOSIS — R102 Pelvic and perineal pain: Secondary | ICD-10-CM | POA: Diagnosis not present

## 2021-07-22 DIAGNOSIS — R69 Illness, unspecified: Secondary | ICD-10-CM | POA: Diagnosis not present

## 2021-07-23 DIAGNOSIS — M545 Low back pain, unspecified: Secondary | ICD-10-CM | POA: Diagnosis not present

## 2021-07-23 DIAGNOSIS — M47816 Spondylosis without myelopathy or radiculopathy, lumbar region: Secondary | ICD-10-CM | POA: Diagnosis not present

## 2021-07-23 DIAGNOSIS — M48061 Spinal stenosis, lumbar region without neurogenic claudication: Secondary | ICD-10-CM | POA: Diagnosis not present

## 2021-07-24 DIAGNOSIS — M7918 Myalgia, other site: Secondary | ICD-10-CM | POA: Diagnosis not present

## 2021-07-24 DIAGNOSIS — R102 Pelvic and perineal pain: Secondary | ICD-10-CM | POA: Diagnosis not present

## 2021-07-24 DIAGNOSIS — N946 Dysmenorrhea, unspecified: Secondary | ICD-10-CM | POA: Diagnosis not present

## 2021-07-25 DIAGNOSIS — R102 Pelvic and perineal pain: Secondary | ICD-10-CM | POA: Diagnosis not present

## 2021-07-25 DIAGNOSIS — N946 Dysmenorrhea, unspecified: Secondary | ICD-10-CM | POA: Diagnosis not present

## 2021-07-29 DIAGNOSIS — R69 Illness, unspecified: Secondary | ICD-10-CM | POA: Diagnosis not present

## 2021-07-30 DIAGNOSIS — N946 Dysmenorrhea, unspecified: Secondary | ICD-10-CM | POA: Diagnosis not present

## 2021-07-30 DIAGNOSIS — M7918 Myalgia, other site: Secondary | ICD-10-CM | POA: Diagnosis not present

## 2021-07-30 DIAGNOSIS — R102 Pelvic and perineal pain: Secondary | ICD-10-CM | POA: Diagnosis not present

## 2021-08-06 DIAGNOSIS — G9689 Other specified disorders of central nervous system: Secondary | ICD-10-CM | POA: Diagnosis not present

## 2021-08-06 DIAGNOSIS — R69 Illness, unspecified: Secondary | ICD-10-CM | POA: Diagnosis not present

## 2021-08-06 DIAGNOSIS — M48061 Spinal stenosis, lumbar region without neurogenic claudication: Secondary | ICD-10-CM | POA: Diagnosis not present

## 2021-08-13 DIAGNOSIS — M533 Sacrococcygeal disorders, not elsewhere classified: Secondary | ICD-10-CM | POA: Diagnosis not present

## 2021-08-13 DIAGNOSIS — R69 Illness, unspecified: Secondary | ICD-10-CM | POA: Diagnosis not present

## 2021-08-19 DIAGNOSIS — R69 Illness, unspecified: Secondary | ICD-10-CM | POA: Diagnosis not present

## 2021-08-27 DIAGNOSIS — R69 Illness, unspecified: Secondary | ICD-10-CM | POA: Diagnosis not present

## 2021-09-02 DIAGNOSIS — R69 Illness, unspecified: Secondary | ICD-10-CM | POA: Diagnosis not present

## 2021-09-05 DIAGNOSIS — N915 Oligomenorrhea, unspecified: Secondary | ICD-10-CM | POA: Diagnosis not present

## 2021-09-05 DIAGNOSIS — M7918 Myalgia, other site: Secondary | ICD-10-CM | POA: Diagnosis not present

## 2021-09-05 DIAGNOSIS — R102 Pelvic and perineal pain: Secondary | ICD-10-CM | POA: Diagnosis not present

## 2021-09-05 DIAGNOSIS — N946 Dysmenorrhea, unspecified: Secondary | ICD-10-CM | POA: Diagnosis not present

## 2021-09-06 ENCOUNTER — Other Ambulatory Visit: Payer: Self-pay | Admitting: Internal Medicine

## 2021-09-06 DIAGNOSIS — F324 Major depressive disorder, single episode, in partial remission: Secondary | ICD-10-CM

## 2021-09-06 DIAGNOSIS — N915 Oligomenorrhea, unspecified: Secondary | ICD-10-CM | POA: Diagnosis not present

## 2021-09-10 DIAGNOSIS — R69 Illness, unspecified: Secondary | ICD-10-CM | POA: Diagnosis not present

## 2021-10-13 DIAGNOSIS — R69 Illness, unspecified: Secondary | ICD-10-CM | POA: Diagnosis not present

## 2021-10-14 DIAGNOSIS — R69 Illness, unspecified: Secondary | ICD-10-CM | POA: Diagnosis not present

## 2021-10-21 ENCOUNTER — Emergency Department (HOSPITAL_COMMUNITY): Payer: 59

## 2021-10-21 ENCOUNTER — Encounter (HOSPITAL_COMMUNITY): Payer: Self-pay

## 2021-10-21 ENCOUNTER — Other Ambulatory Visit: Payer: Self-pay

## 2021-10-21 ENCOUNTER — Emergency Department (HOSPITAL_COMMUNITY)
Admission: EM | Admit: 2021-10-21 | Discharge: 2021-10-21 | Disposition: A | Discharge status: Home / Self Care | Payer: 59 | Attending: Emergency Medicine | Admitting: Emergency Medicine

## 2021-10-21 DIAGNOSIS — S3210XA Unspecified fracture of sacrum, initial encounter for closed fracture: Secondary | ICD-10-CM | POA: Diagnosis not present

## 2021-10-21 DIAGNOSIS — M545 Low back pain, unspecified: Secondary | ICD-10-CM | POA: Diagnosis not present

## 2021-10-21 DIAGNOSIS — S32110A Nondisplaced Zone I fracture of sacrum, initial encounter for closed fracture: Secondary | ICD-10-CM | POA: Diagnosis not present

## 2021-10-21 DIAGNOSIS — R0902 Hypoxemia: Secondary | ICD-10-CM | POA: Diagnosis not present

## 2021-10-21 DIAGNOSIS — R531 Weakness: Secondary | ICD-10-CM | POA: Diagnosis not present

## 2021-10-21 DIAGNOSIS — W19XXXA Unspecified fall, initial encounter: Secondary | ICD-10-CM | POA: Insufficient documentation

## 2021-10-21 DIAGNOSIS — R209 Unspecified disturbances of skin sensation: Secondary | ICD-10-CM | POA: Insufficient documentation

## 2021-10-21 DIAGNOSIS — I959 Hypotension, unspecified: Secondary | ICD-10-CM | POA: Diagnosis not present

## 2021-10-21 DIAGNOSIS — S34139A Unspecified injury to sacral spinal cord, initial encounter: Secondary | ICD-10-CM | POA: Diagnosis not present

## 2021-10-21 LAB — COMPREHENSIVE METABOLIC PANEL
ALT: 13 U/L (ref 0–44)
AST: 14 U/L — ABNORMAL LOW (ref 15–41)
Albumin: 3.8 g/dL (ref 3.5–5.0)
Alkaline Phosphatase: 42 U/L (ref 38–126)
Anion gap: 6 (ref 5–15)
BUN: 13 mg/dL (ref 6–20)
CO2: 18 mmol/L — ABNORMAL LOW (ref 22–32)
Calcium: 8.9 mg/dL (ref 8.9–10.3)
Chloride: 114 mmol/L — ABNORMAL HIGH (ref 98–111)
Creatinine, Ser: 0.97 mg/dL (ref 0.44–1.00)
GFR, Estimated: >60 mL/min (ref >60)
Glucose, Bld: 80 mg/dL (ref 70–99)
Potassium: 3.8 mmol/L (ref 3.5–5.1)
Sodium: 138 mmol/L (ref 135–145)
Total Bilirubin: 1 mg/dL (ref 0.3–1.2)
Total Protein: 6.2 g/dL — ABNORMAL LOW (ref 6.5–8.1)

## 2021-10-21 LAB — CBC WITH DIFFERENTIAL/PLATELET
Abs Immature Granulocytes: 0.05 10*3/uL (ref 0.00–0.07)
Basophils Absolute: 0.1 10*3/uL (ref 0.0–0.1)
Basophils Relative: 2 %
Eosinophils Absolute: 0.3 10*3/uL (ref 0.0–0.5)
Eosinophils Relative: 6 %
HCT: 39.8 % (ref 36.0–46.0)
Hemoglobin: 13.4 g/dL (ref 12.0–15.0)
Immature Granulocytes: 1 %
Lymphocytes Relative: 43 %
Lymphs Abs: 2.6 10*3/uL (ref 0.7–4.0)
MCH: 30 pg (ref 26.0–34.0)
MCHC: 33.7 g/dL (ref 30.0–36.0)
MCV: 89 fL (ref 80.0–100.0)
Monocytes Absolute: 0.4 10*3/uL (ref 0.1–1.0)
Monocytes Relative: 7 %
Neutro Abs: 2.4 10*3/uL (ref 1.7–7.7)
Neutrophils Relative %: 41 %
Platelets: 221 10*3/uL (ref 150–400)
RBC: 4.47 MIL/uL (ref 3.87–5.11)
RDW: 12.6 % (ref 11.5–15.5)
WBC: 5.9 10*3/uL (ref 4.0–10.5)
nRBC: 0 % (ref 0.0–0.2)

## 2021-10-21 LAB — URINALYSIS, ROUTINE W REFLEX MICROSCOPIC
Bilirubin Urine: NEGATIVE
Glucose, UA: NEGATIVE mg/dL
Hgb urine dipstick: NEGATIVE
Ketones, ur: 5 mg/dL — AB
Leukocytes,Ua: NEGATIVE
Nitrite: NEGATIVE
Protein, ur: NEGATIVE mg/dL
Specific Gravity, Urine: 1.011 (ref 1.005–1.030)
pH: 6 (ref 5.0–8.0)

## 2021-10-21 LAB — LIPASE, BLOOD: Lipase: 43 U/L (ref 11–51)

## 2021-10-21 IMAGING — MR MR LUMBAR SPINE W/O CM
4 of 5 series · 19 of 48 positions shown · non-contrast
Comparison: [NV]

CLINICAL DATA: Low back pain, trauma

EXAM:
MRI LUMBAR SPINE WITHOUT CONTRAST
TECHNIQUE: Multiplanar, multisequence MR imaging of the lumbar spine was
performed. No intravenous contrast was administered.

[Series 3: T2 · sagittal · 4.0mm · 0.51mm/px · 6 of 14 slices shown (1 of 2)]
[im 1/14]
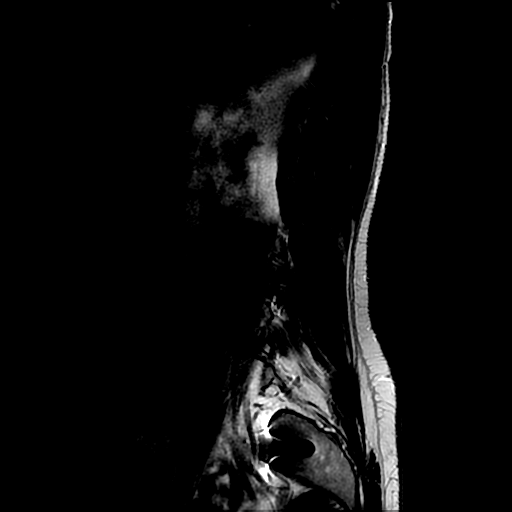
[im 3/14]
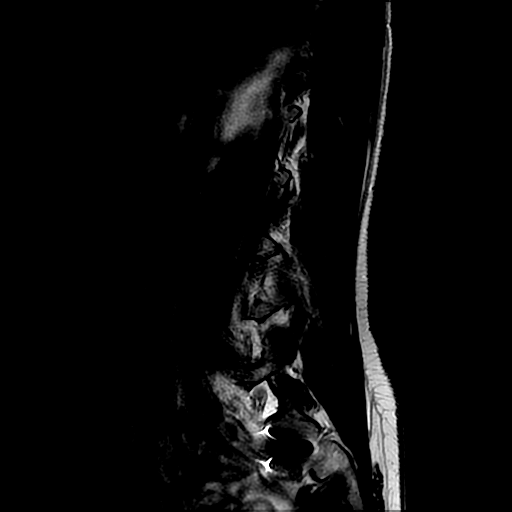
[im 6/14]
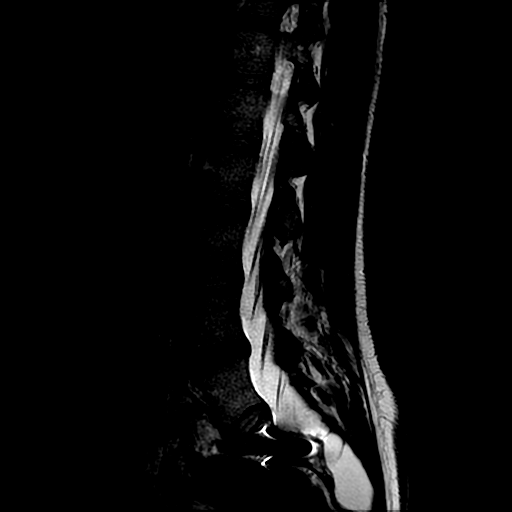
[im 8/14]
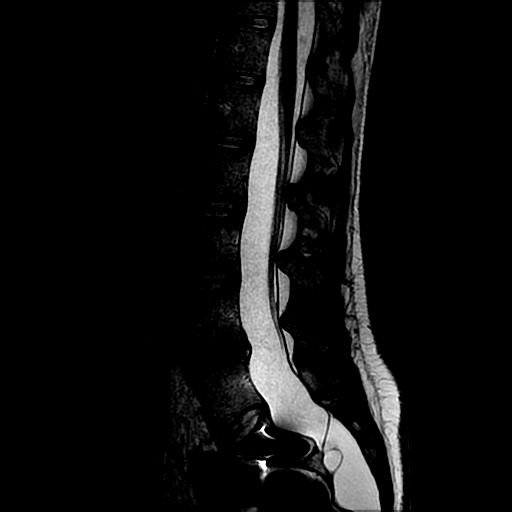
[im 11/14]
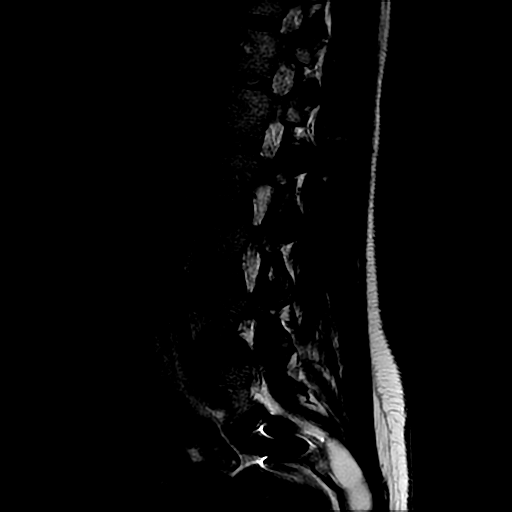
[im 14/14]
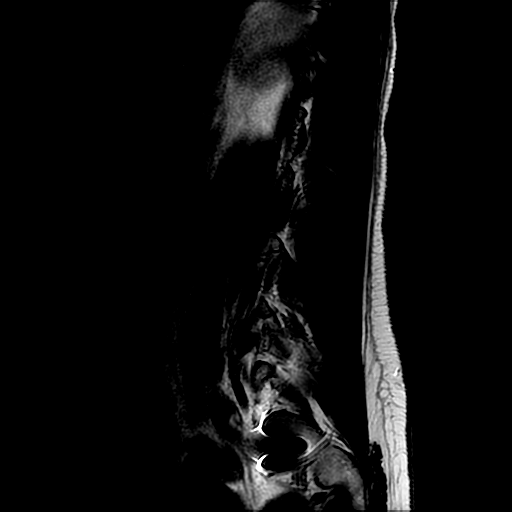

[Series 4: T1 · sagittal · 4.0mm · 0.51mm/px · 3 of 14 slices shown (1 of 2)]
[im 3/14]
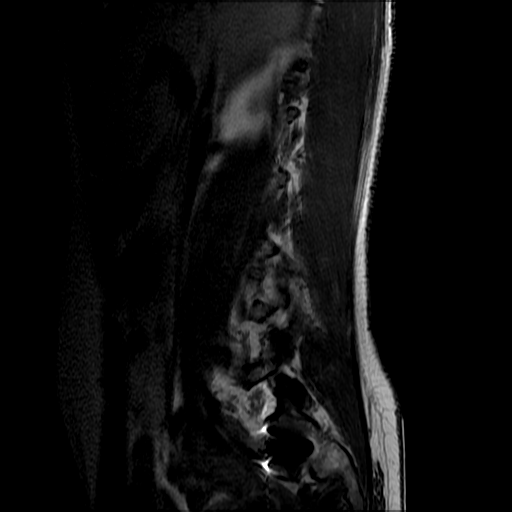
[im 8/14]
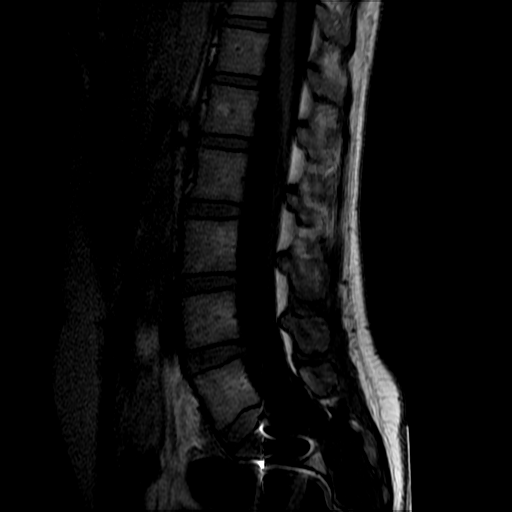
[im 14/14]
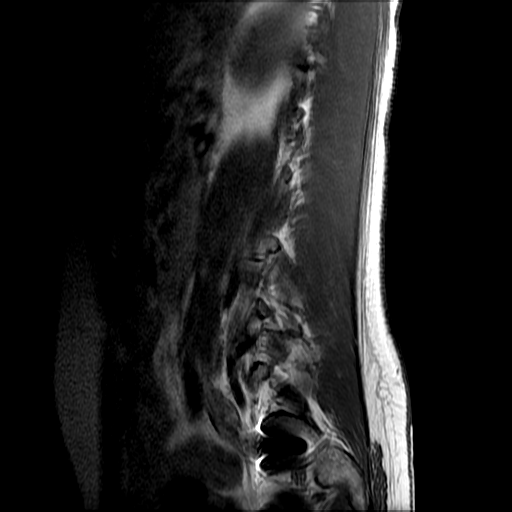

[Series 6: T2 · axial · 4.0mm · 0.39mm/px · z∈[-171,+5]mm · 7 of 38 slices shown (2 of 2)]
[im 1/38]
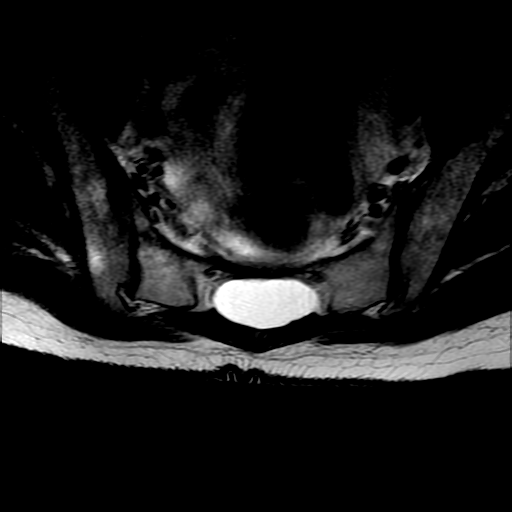
[im 6/38]
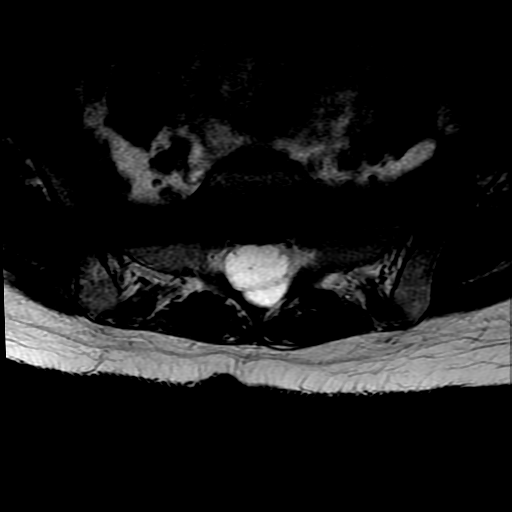
[im 11/38]
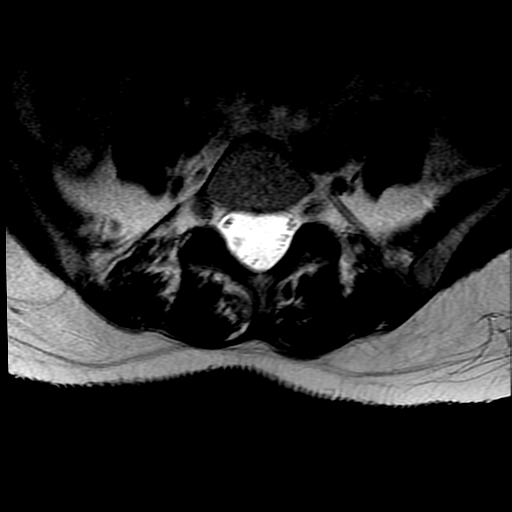
[im 16/38]
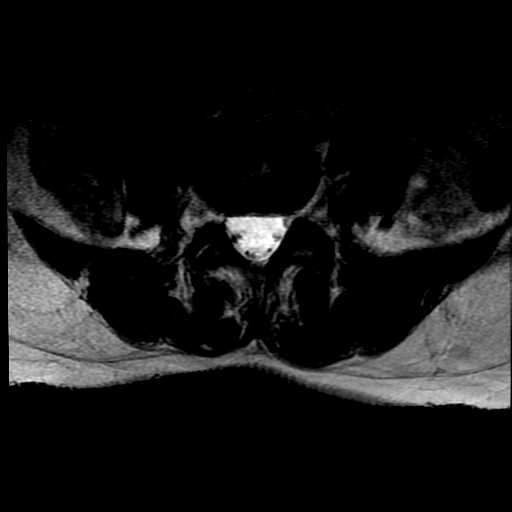
[im 19/38]
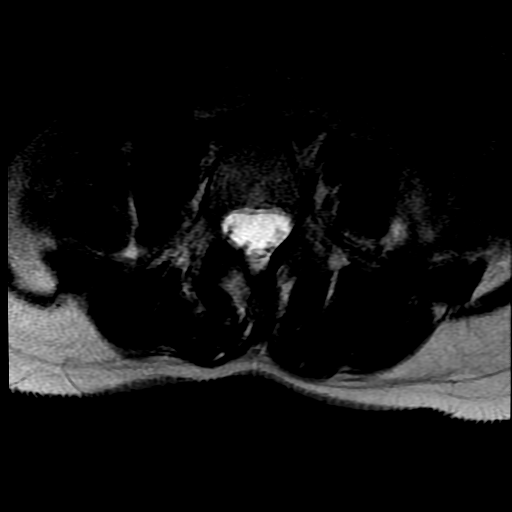
[im 22/38]
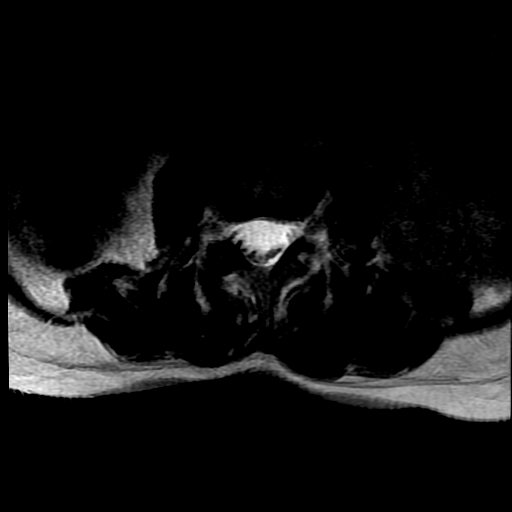
[im 32/38]
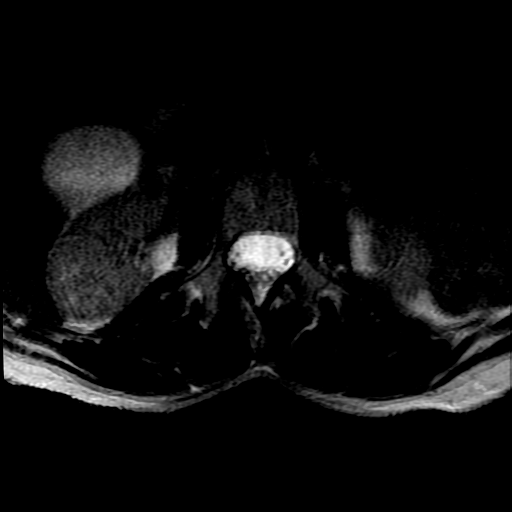

[Series 7: T1 · axial · 4.0mm · 0.39mm/px · z∈[-147,+5]mm · 3 of 38 slices shown (2 of 2)]
[im 6/38]
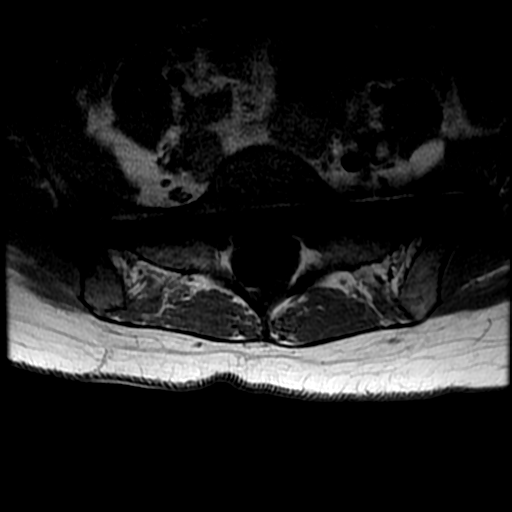
[im 19/38]
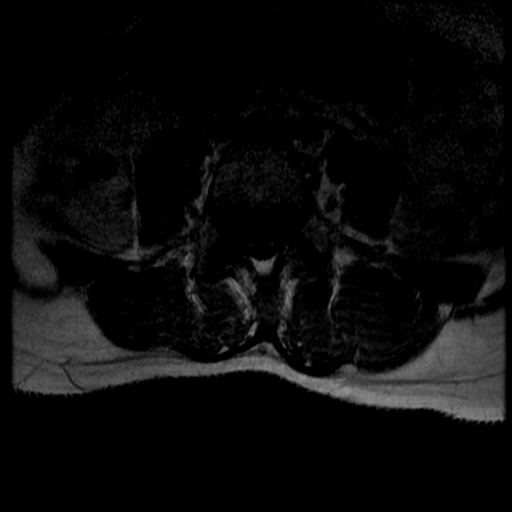
[im 32/38]
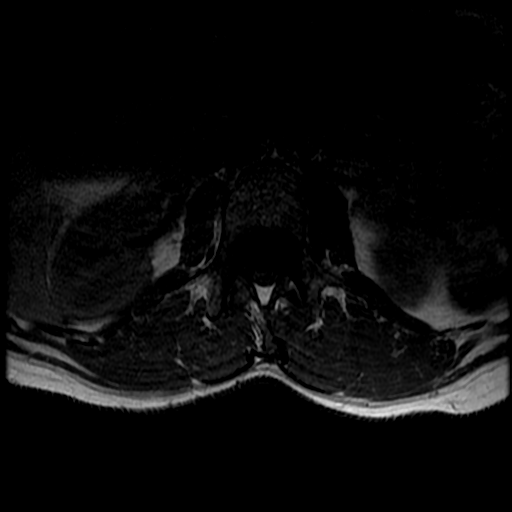

[19 of 48 positions shown; findings below may reference images not displayed]

FINDINGS: Segmentation:  Standard.

Alignment:  Stable.

Vertebrae: Stable vertebral body heights. Susceptibility artifact at
the level of bilateral sacroiliac joint fixation spanning S1. No
marrow edema. Marked scalloping of the dorsal margin of the sacrum
particularly at S2 and S3 as before. This is secondary to dural
ectasia as well as dural cysts at the sacral level.

Conus medullaris and cauda equina: Conus extends to the L1-L2 level.
Conus and cauda equina appear normal.

Paraspinal and other soft tissues: Unremarkable.

Disc levels: Similar disc bulge with punctate central annular
fissure at L4-L5. No canal or foraminal stenosis.
IMPRESSION: No acute abnormality.

## 2021-10-21 IMAGING — CT CT L SPINE W/O CM
3 of 5 series · 14 of 33 positions shown, 16 images · non-contrast
Comparison: MRI lumbar spine dated [DATE].

CLINICAL DATA: Low back pain after fall.



[Series 5: l spine soft · axial · 0.32mm/px · z∈[-258,-16]mm · 8 of 144 slices shown, 10 images]
[im 12/144  soft-tissue]
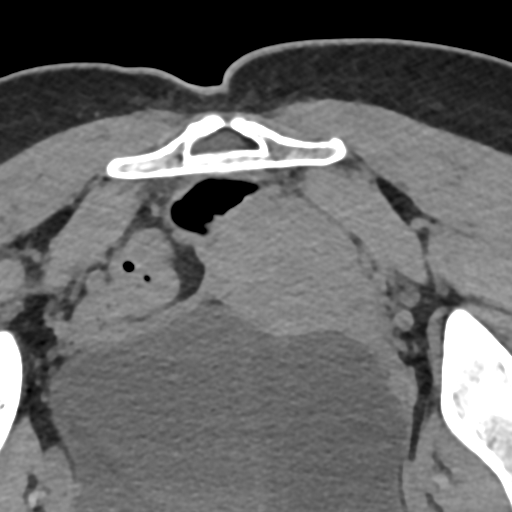
[im 12/144  bone]
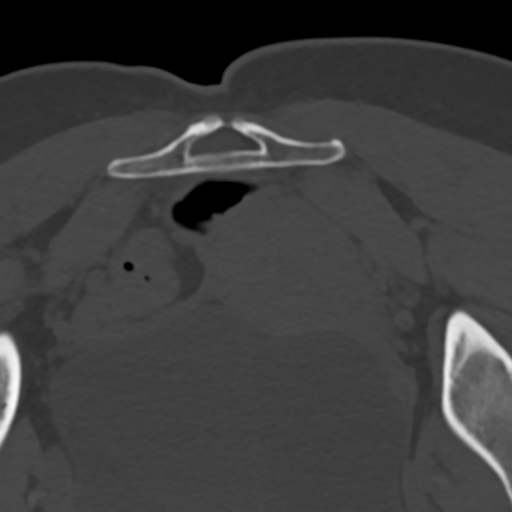
[im 34/144  bone]
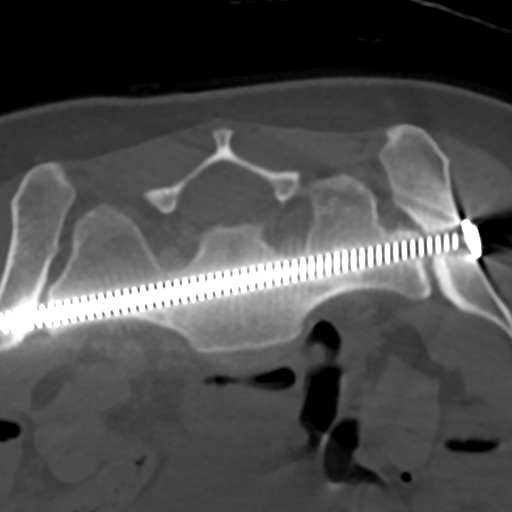
[im 45/144  bone]
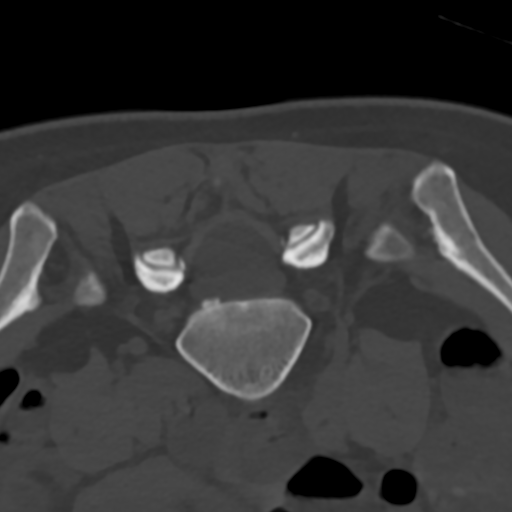
[im 67/144  bone]
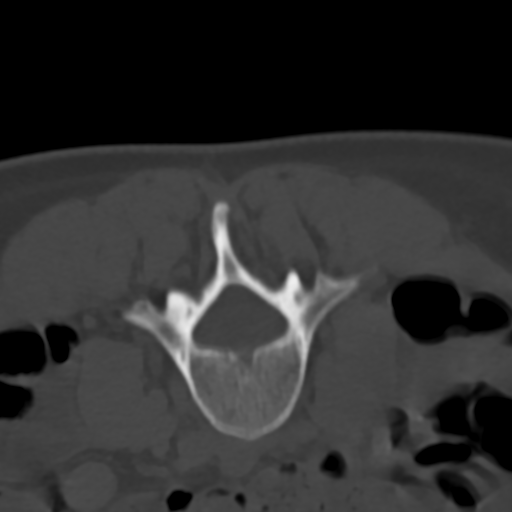
[im 78/144  soft-tissue]
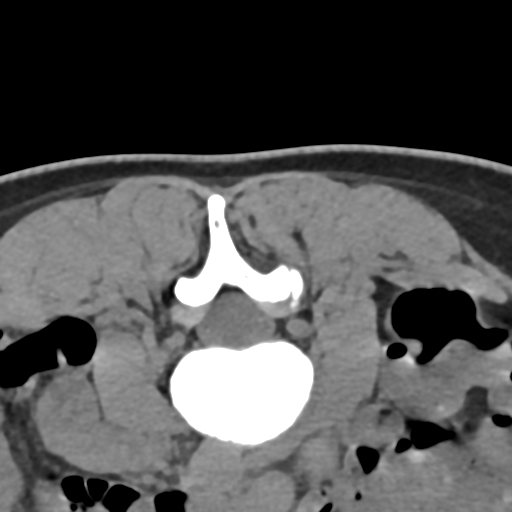
[im 78/144  bone]
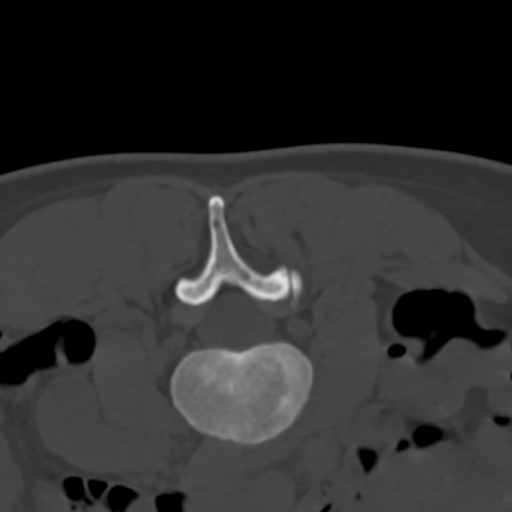
[im 100/144  bone]
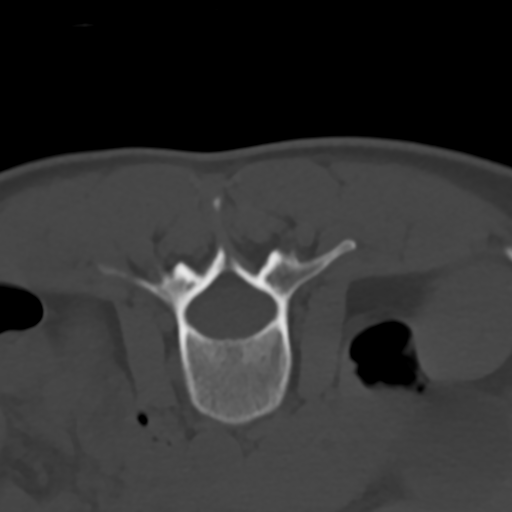
[im 111/144  bone]
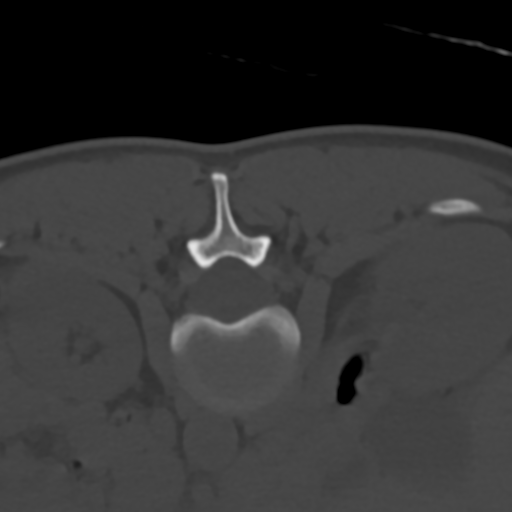
[im 133/144  bone]
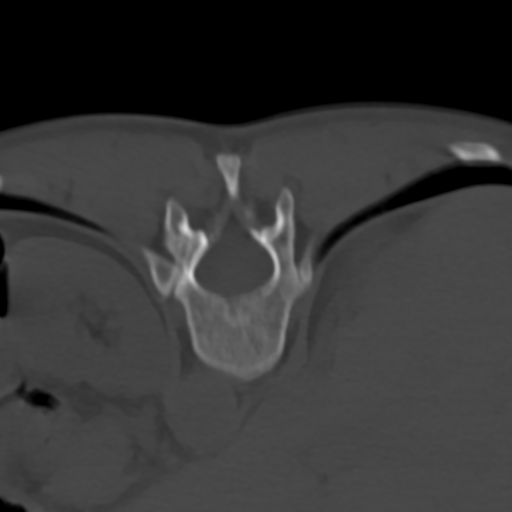

[Series 7: cor bone · coronal · 0.38mm/px · 1 of 84 slices shown]
[im 42/84  bone]
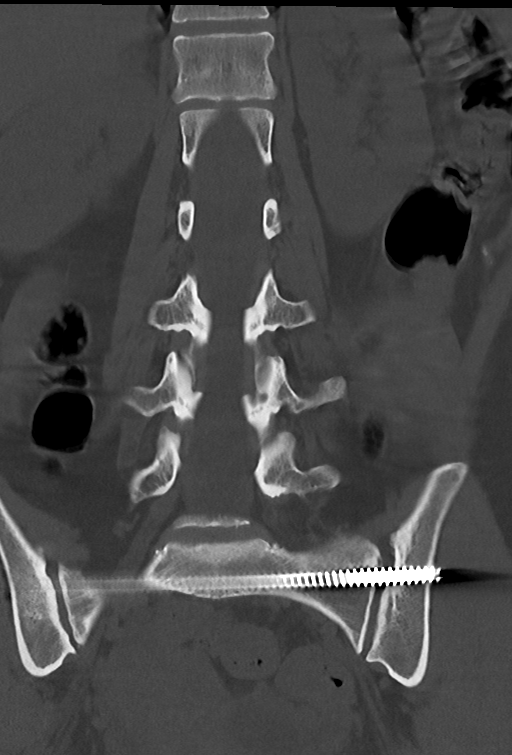

[Series 9: sag st · sagittal · 0.36mm/px · 5 of 121 slices shown]
[im 21/121  bone]
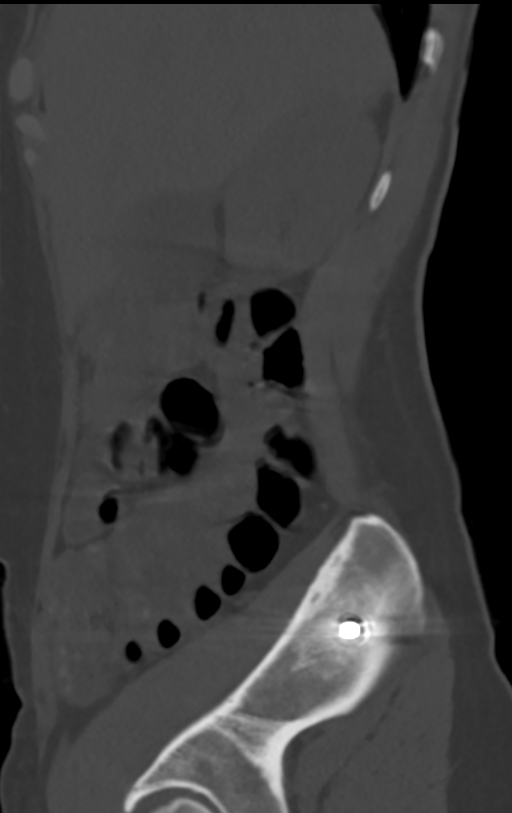
[im 41/121  bone]
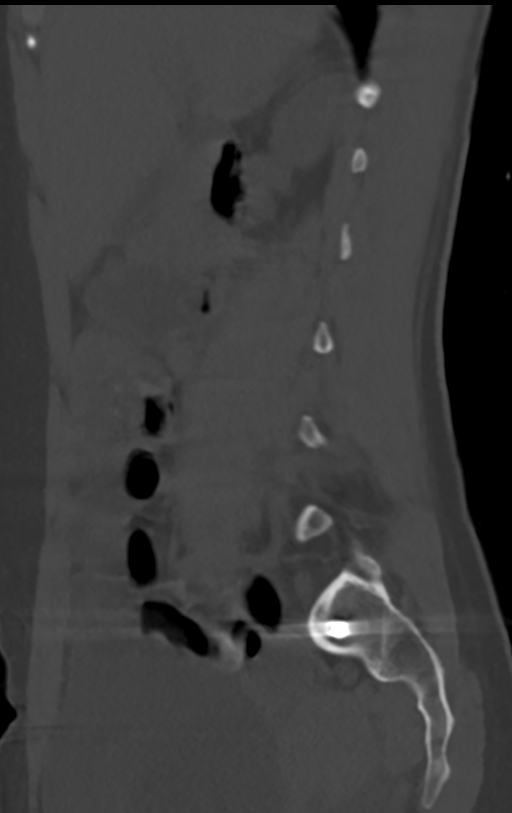
[im 61/121  bone]
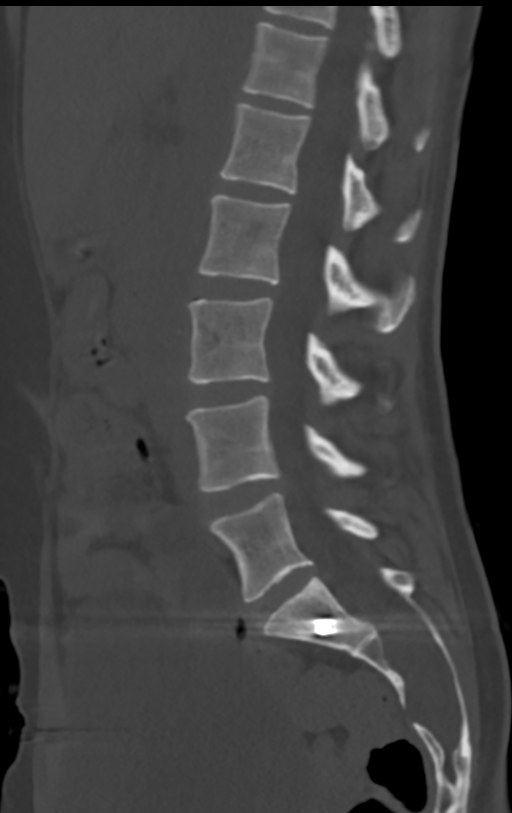
[im 81/121  bone]
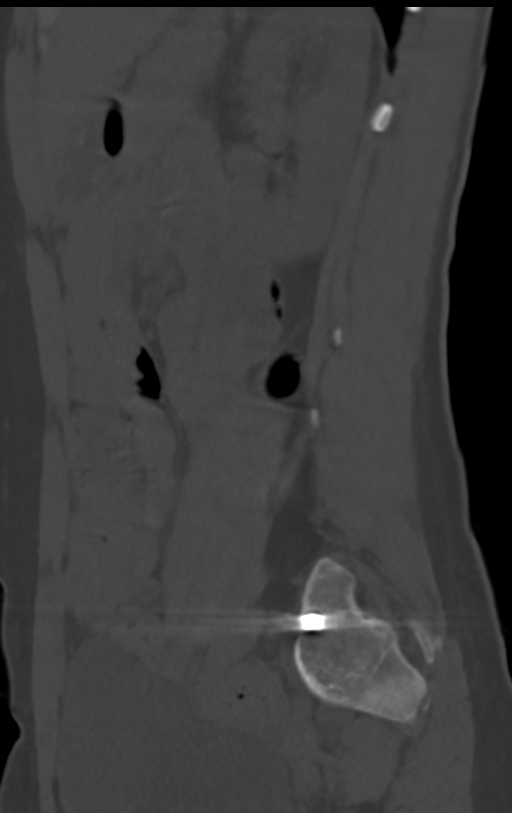
[im 101/121  bone]
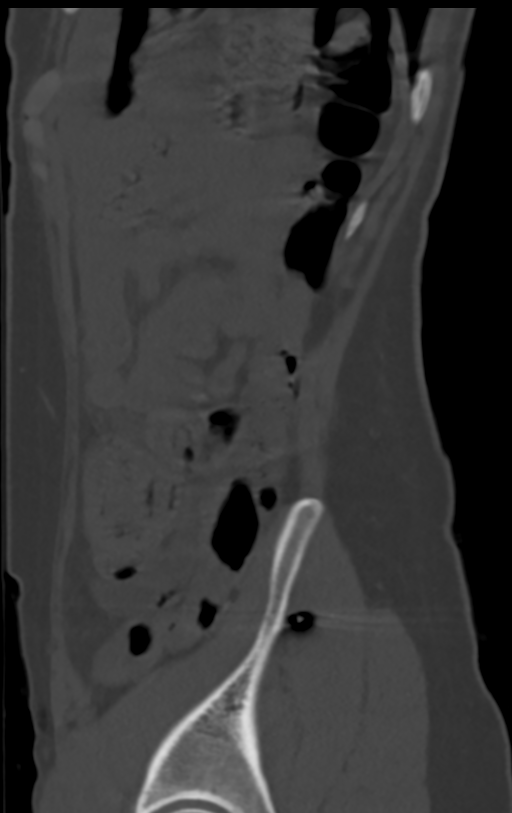

[14 of 33 positions shown; findings below may reference images not displayed]

FINDINGS: Segmentation: 5 lumbar type vertebrae.

Alignment: Normal.

Vertebrae: New small acute nondisplaced fracture of the left
anterior sacrum at S3-S4 (series 4, images 120 9-130). No additional
fracture. Unchanged sacral dural ectasia large Tarlov cysts with
scalloping of the S2 and S3 vertebral bodies.

Paraspinal and other soft tissues: Prior screw fixation of the
sacroiliac joints. Otherwise negative.

Disc levels: Mild disc bulging at L4-L5. No spinal canal or
neuroforaminal stenosis at any level.
IMPRESSION: 1. New small acute nondisplaced fracture of the left anterior sacrum
at S3-S4. No lumbar spine fracture.

## 2021-10-21 IMAGING — CT CT PELVIS W/O CM
3 of 10 series · 12 of 33 positions shown, 13 images · non-contrast
Comparison: MRI sacrum dated [DATE].

CLINICAL DATA: Low back and sacral pain after fall.

EXAM:
CT PELVIS WITHOUT CONTRAST
TECHNIQUE: Multidetector CT imaging of the pelvis was performed following the
standard protocol without intravenous contrast.
RADIATION DOSE REDUCTION: This exam was performed according to the
departmental dose-optimization program which includes automated
exposure control, adjustment of the mA and/or kV according to
patient size and/or use of iterative reconstruction technique.

[Series 5: l spine soft · axial · 0.32mm/px · z∈[-250,-26]mm · 8 of 144 slices shown]
[im 16/144  soft-tissue]
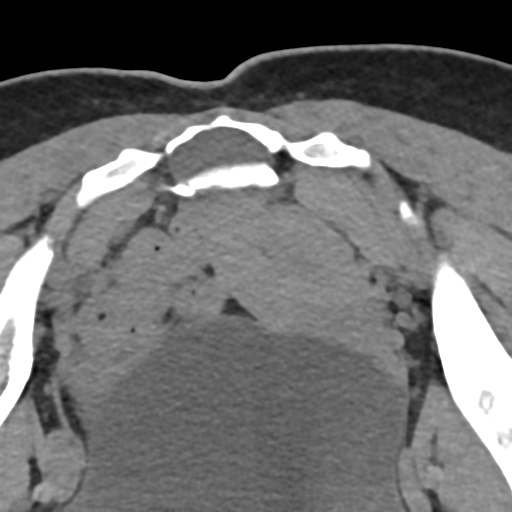
[im 32/144  soft-tissue]
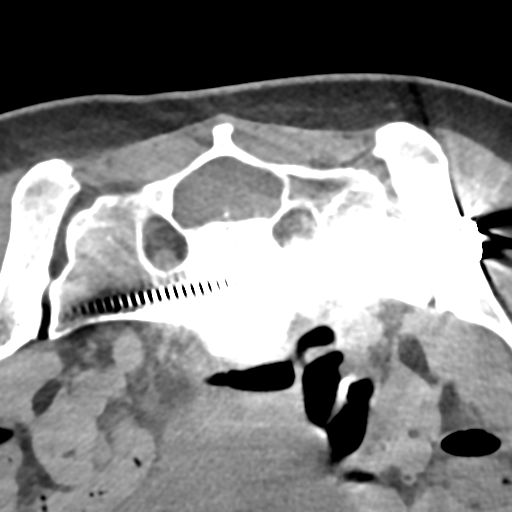
[im 48/144  soft-tissue]
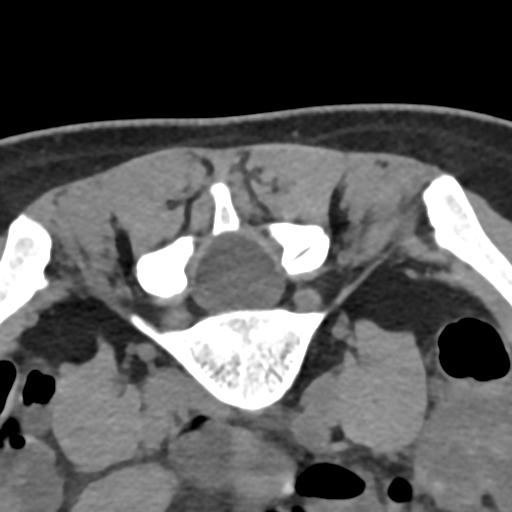
[im 64/144  soft-tissue]
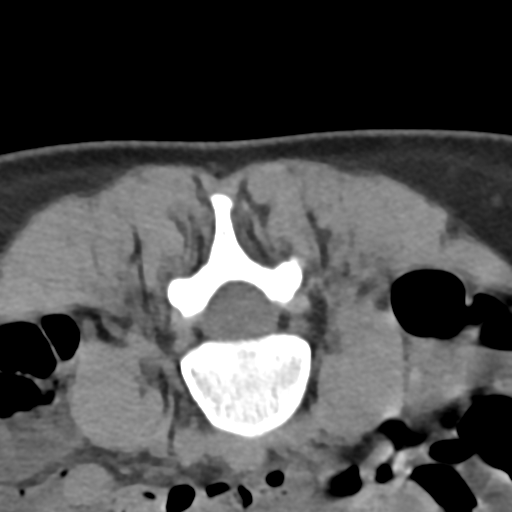
[im 80/144  soft-tissue]
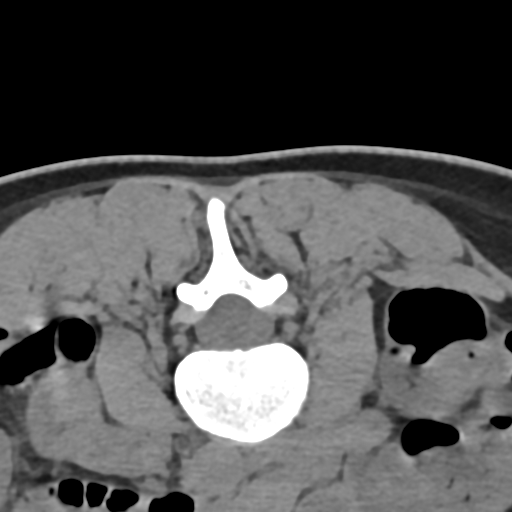
[im 96/144  soft-tissue]
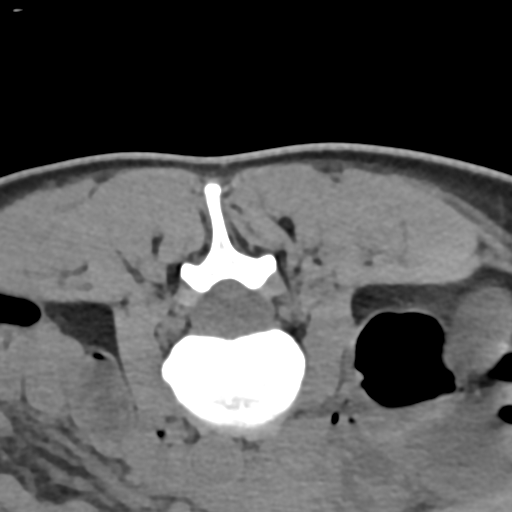
[im 112/144  soft-tissue]
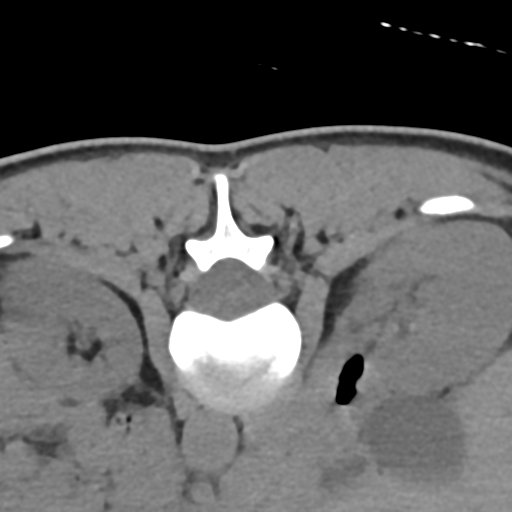
[im 128/144  soft-tissue]
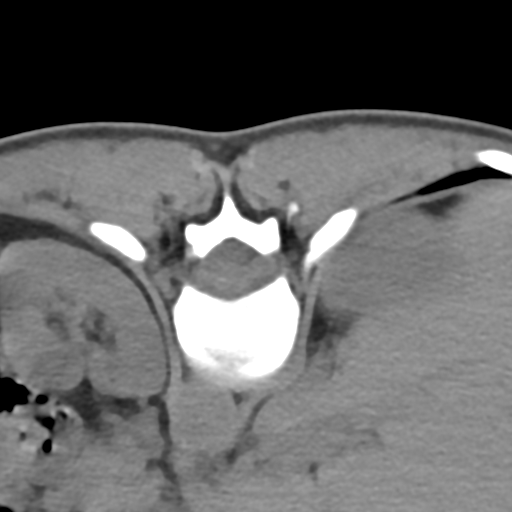

[Series 12: soft tissue (person_name) · axial · 0.81mm/px · z∈[-350,-245]mm · 2 of 65 slices shown, 3 images]
[im 22/65  soft-tissue]
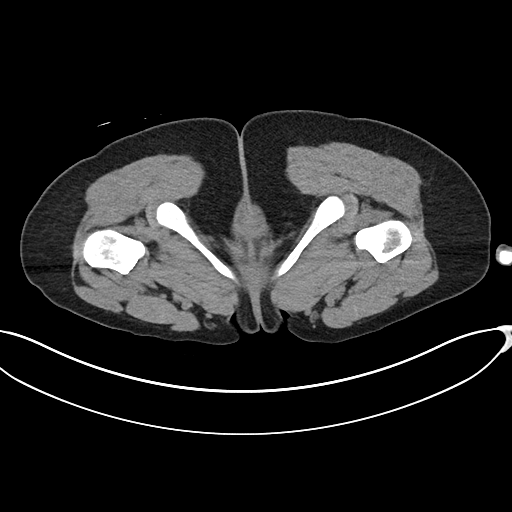
[im 22/65  bone]
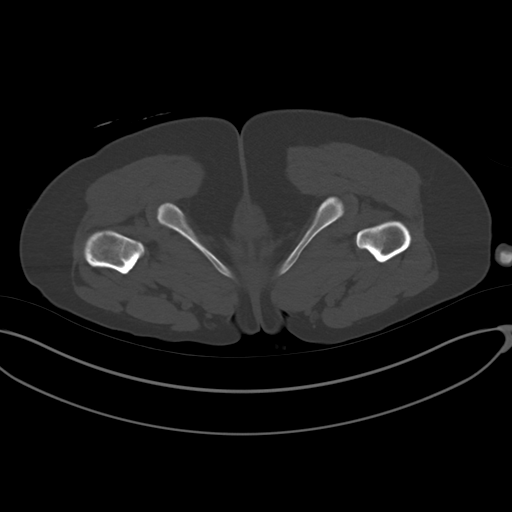
[im 43/65  bone]
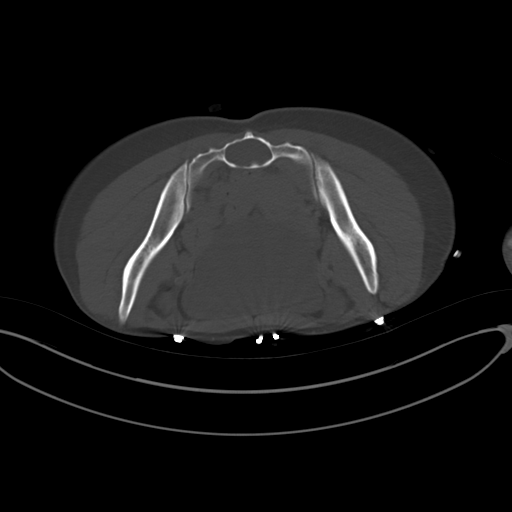

[Series 15: sag (person_name) · sagittal · 0.41mm/px · 2 of 195 slices shown]
[im 65/195  bone]
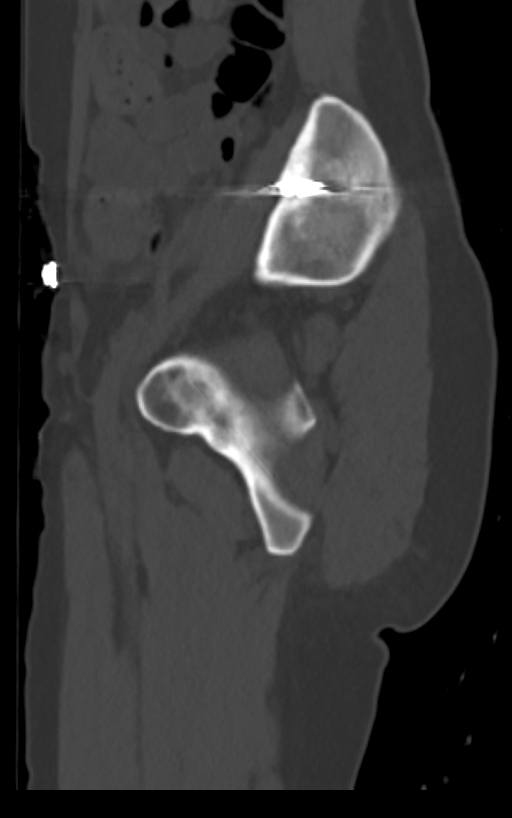
[im 130/195  bone]
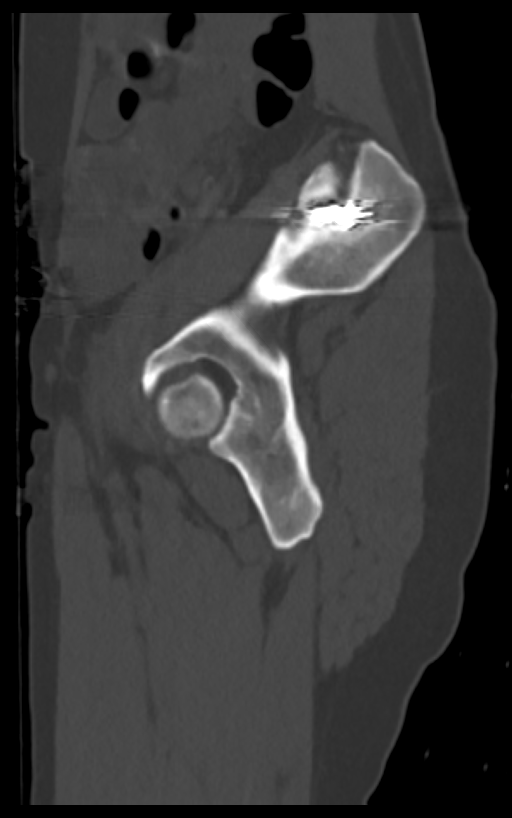

[12 of 33 positions shown; findings below may reference images not displayed]

FINDINGS: Urinary Tract:  No abnormality visualized.

Bowel:  Unremarkable visualized pelvic bowel loops.

Vascular/Lymphatic: No pathologically enlarged lymph nodes. No
significant vascular abnormality seen.

Reproductive:  Uterus and bilateral adnexa are unremarkable.

Other:  None.

Musculoskeletal: New small acute nondisplaced fracture of the left
anterior sacrum at S3-S4 (series 13, image 24). Prior screw fixation
of the sacroiliac joints. No diastasis. Unchanged sacral dural
ectasia and large Tarlov cysts with scalloping of the S2 and S3
vertebral bodies.
IMPRESSION: 1. New small acute nondisplaced fracture of the left anterior sacrum
at S3-S4.

## 2021-10-21 MED ORDER — LIDOCAINE 5 % EX PTCH
1.0000 | MEDICATED_PATCH | CUTANEOUS | Status: DC
  Administered 2021-10-21: 1 via TRANSDERMAL
  Filled 2021-10-21: qty 1

## 2021-10-21 MED ORDER — ACETAMINOPHEN 10 MG/ML IV SOLN
1000.0000 mg | Freq: Four times a day (QID) | INTRAVENOUS | Status: DC
  Administered 2021-10-21: 1000 mg via INTRAVENOUS
  Filled 2021-10-21 (×3): qty 100

## 2021-10-21 MED ORDER — ACETAMINOPHEN 10 MG/ML IV SOLN
1000.0000 mg | Freq: Four times a day (QID) | INTRAVENOUS | Status: DC
  Filled 2021-10-21: qty 100

## 2021-10-21 MED ORDER — MORPHINE SULFATE (PF) 4 MG/ML IV SOLN
4.0000 mg | Freq: Once | INTRAVENOUS | Status: AC
  Administered 2021-10-21: 4 mg via INTRAVENOUS
  Filled 2021-10-21: qty 1

## 2021-10-21 MED ORDER — LORAZEPAM 2 MG/ML IJ SOLN
1.0000 mg | INTRAMUSCULAR | Status: DC | PRN
  Administered 2021-10-21: 1 mg via INTRAVENOUS
  Filled 2021-10-21: qty 1

## 2021-10-21 MED ORDER — FENTANYL CITRATE PF 50 MCG/ML IJ SOSY
50.0000 ug | PREFILLED_SYRINGE | Freq: Once | INTRAMUSCULAR | Status: AC
  Administered 2021-10-21: 50 ug via INTRAVENOUS
  Filled 2021-10-21: qty 1

## 2021-10-21 NOTE — Discharge Instructions (Signed)
You have been evaluated for your recent fall.  CT scan demonstrate an acute nondisplaced sacral fracture at the level of S3-S4.  MRI today did not show any signs of cauda equina or cord compression.  It is important for you to reach out to your orthopedist/neurosurgeon at [facility] for further assessments of your condition.  You should also follow-up with urologist in the next several days for further managements of your urinary retention.  Please be aware that having a Foley catheter may increased risk of developing urinary tract infection.  You may continue taking your opiate pain medication or Tylenol at home as needed for pain.  Return to the ER if your symptoms worsen or if you have any other concern. ?

## 2021-10-21 NOTE — ED Notes (Signed)
ED tech stated that patient attempted to void earlier around 1700 with very minimal output. Bladder scan showed greater than 554m. Bowie,PA made aware. Pt continues to have decreased sensation to bilateral hip regions but does have positive cms distally  ?

## 2021-10-21 NOTE — ED Provider Notes (Signed)
? ?Received signout from previous provider, please see his note for complete H&P.  This is a 46 year old female who tripped and fell landed sacral region on a brick earlier today.  She is complaining of sacral pain and now complaining of numbness and tingling sensation to her hip and left leg.  Initial CT scan of the lumbar spine demonstrate new small acute nondisplaced fracture of the left anterior sacrum at S2-S3 along with mild disc bulging at L4-L5.  CT pelvis shows new small acute nondisplaced fracture of the left anterior sacrum at S3-S4.  Initially orthopedist was consulted but due to patient's sensory deficits and radicular pain they recommend consulting neurosurgery instead. ? ?5:36 PM ?Patient report urge to urinate, bedpan was placed however she produced minimal urine despite trying.  Postvoid residual was obtained using bladder ultrasound revealing greater than 500 retained urine.  We will place Foley catheter, but patient likely may need to be admitted for further work-up if MRI of lumbar spine is abnormal. ? ?9:06 PM ?MRI of the lumbar spine without contrast was independently reviewed and interpreted by me and I agree with radiologist interpretation.  No acute abnormalities were noted. ? ?In regards to patient's urinary retention, with suspect it could be due to position as patient does not feel comfortable urinating while in a bedpan.  Her preference is to have a Foley catheter and she agrees to follow-up closely with urology for reevaluation.  Labs obtained today and independently viewed interpreted by me and are reassuring.  Urinalysis without signs of urinary tract infection. ? ?Patient reports she still has opiate pain medication at home to use as needed.  She does have a walker at home as well.  She will follow-up closely with orthopedist for further management of her condition.  Recommend using donut pillow for support and return precaution discussed.  All questions answered to patient  satisfaction.  At this time her pain is well controlled with current treatment. ? ?BP 108/62 (BP Location: Left Arm)   Pulse (!) 58   Temp 98.3 ?F (36.8 ?C) (Oral)   Resp 16   Ht '5\' 2"'$  (1.575 m)   Wt 54.4 kg   SpO2 100%   BMI 21.95 kg/m?  ? ?Results for orders placed or performed during the hospital encounter of 10/21/21  ?CBC with Differential  ?Result Value Ref Range  ? WBC 5.9 4.0 - 10.5 K/uL  ? RBC 4.47 3.87 - 5.11 MIL/uL  ? Hemoglobin 13.4 12.0 - 15.0 g/dL  ? HCT 39.8 36.0 - 46.0 %  ? MCV 89.0 80.0 - 100.0 fL  ? MCH 30.0 26.0 - 34.0 pg  ? MCHC 33.7 30.0 - 36.0 g/dL  ? RDW 12.6 11.5 - 15.5 %  ? Platelets 221 150 - 400 K/uL  ? nRBC 0.0 0.0 - 0.2 %  ? Neutrophils Relative % 41 %  ? Neutro Abs 2.4 1.7 - 7.7 K/uL  ? Lymphocytes Relative 43 %  ? Lymphs Abs 2.6 0.7 - 4.0 K/uL  ? Monocytes Relative 7 %  ? Monocytes Absolute 0.4 0.1 - 1.0 K/uL  ? Eosinophils Relative 6 %  ? Eosinophils Absolute 0.3 0.0 - 0.5 K/uL  ? Basophils Relative 2 %  ? Basophils Absolute 0.1 0.0 - 0.1 K/uL  ? Immature Granulocytes 1 %  ? Abs Immature Granulocytes 0.05 0.00 - 0.07 K/uL  ?Comprehensive metabolic panel  ?Result Value Ref Range  ? Sodium 138 135 - 145 mmol/L  ? Potassium 3.8 3.5 - 5.1 mmol/L  ?  Chloride 114 (H) 98 - 111 mmol/L  ? CO2 18 (L) 22 - 32 mmol/L  ? Glucose, Bld 80 70 - 99 mg/dL  ? BUN 13 6 - 20 mg/dL  ? Creatinine, Ser 0.97 0.44 - 1.00 mg/dL  ? Calcium 8.9 8.9 - 10.3 mg/dL  ? Total Protein 6.2 (L) 6.5 - 8.1 g/dL  ? Albumin 3.8 3.5 - 5.0 g/dL  ? AST 14 (L) 15 - 41 U/L  ? ALT 13 0 - 44 U/L  ? Alkaline Phosphatase 42 38 - 126 U/L  ? Total Bilirubin 1.0 0.3 - 1.2 mg/dL  ? GFR, Estimated >60 >60 mL/min  ? Anion gap 6 5 - 15  ?Lipase, blood  ?Result Value Ref Range  ? Lipase 43 11 - 51 U/L  ?Urinalysis, Routine w reflex microscopic Urine, Catheterized  ?Result Value Ref Range  ? Color, Urine YELLOW YELLOW  ? APPearance CLEAR CLEAR  ? Specific Gravity, Urine 1.011 1.005 - 1.030  ? pH 6.0 5.0 - 8.0  ? Glucose, UA NEGATIVE  NEGATIVE mg/dL  ? Hgb urine dipstick NEGATIVE NEGATIVE  ? Bilirubin Urine NEGATIVE NEGATIVE  ? Ketones, ur 5 (A) NEGATIVE mg/dL  ? Protein, ur NEGATIVE NEGATIVE mg/dL  ? Nitrite NEGATIVE NEGATIVE  ? Leukocytes,Ua NEGATIVE NEGATIVE  ? ?CT Lumbar Spine Wo Contrast ? ?Result Date: 10/21/2021 ?CLINICAL DATA:  Low back pain after fall. EXAM: CT LUMBAR SPINE WITHOUT CONTRAST TECHNIQUE: Multidetector CT imaging of the lumbar spine was performed without intravenous contrast administration. Multiplanar CT image reconstructions were also generated. RADIATION DOSE REDUCTION: This exam was performed according to the departmental dose-optimization program which includes automated exposure control, adjustment of the mA and/or kV according to patient size and/or use of iterative reconstruction technique. COMPARISON:  MRI lumbar spine dated December 29, 2019. FINDINGS: Segmentation: 5 lumbar type vertebrae. Alignment: Normal. Vertebrae: New small acute nondisplaced fracture of the left anterior sacrum at S3-S4 (series 4, images 120 9-130). No additional fracture. 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 Paraspinal and other soft tissues: Prior screw fixation of the sacroiliac joints. Otherwise negative. Disc levels: Mild disc bulging at L4-L5. No spinal canal or neuroforaminal stenosis at any level. IMPRESSION: 1. New small acute nondisplaced fracture of the left anterior sacrum at S3-S4. No lumbar spine fracture. Electronically Signed   By: Titus Dubin M.D.   On: 10/21/2021 14:44  ? ?CT PELVIS WO CONTRAST ? ?Result Date: 10/21/2021 ?CLINICAL DATA:  Low back and sacral pain after fall. EXAM: CT PELVIS WITHOUT CONTRAST TECHNIQUE: Multidetector CT imaging of the pelvis was performed following the standard protocol without intravenous contrast. RADIATION DOSE REDUCTION: This exam was performed according to the departmental dose-optimization program which includes automated  exposure control, adjustment of the mA and/or kV according to patient size and/or use of iterative reconstruction technique. COMPARISON:  MRI sacrum dated December 29, 2019. FINDINGS: Urinary Tract:  No abnormality visualized. Bowel:  Unremarkable visualized pelvic bowel loops. Vascular/Lymphatic: No pathologically enlarged lymph nodes. No significant vascular abnormality seen. Reproductive:  Uterus and bilateral adnexa are unremarkable. Other:  None. Musculoskeletal: New small acute nondisplaced fracture of the left anterior sacrum at S3-S4 (series 13, image 24). Prior screw fixation of the sacroiliac joints. No diastasis. 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 IMPRESSION: 1. New small acute nondisplaced fracture of the left anterior sacrum at S3-S4. Electronically Signed   By: Huntley Dec  Derry M.D.   On: 10/21/2021 15:21  ? ?MR LUMBAR SPINE WO CONTRAST ? ?Result Date: 10/21/2021 ?CLINICAL DATA:  Low back pain, trauma EXAM: MRI LUMBAR SPINE WITHOUT CONTRAST TECHNIQUE: Multiplanar, multisequence MR imaging of the lumbar spine was performed. No intravenous contrast was administered. COMPARISON:  2021 FINDINGS: Segmentation:  Standard. Alignment:  Stable. Vertebrae: Stable vertebral body heights. Susceptibility artifact at the level of bilateral sacroiliac joint fixation spanning S1. No marrow edema. Marked scalloping of the dorsal margin of the sacrum particularly at S2 and S3 as before. This is secondary to dural ectasia as well as dural cysts at the sacral level. Conus medullaris and cauda equina: Conus extends to the L1-L2 level. Conus and cauda equina appear normal. Paraspinal and other soft tissues: Unremarkable. Disc levels: Similar disc bulge with punctate central annular fissure at L4-L5. No canal or foraminal stenosis. IMPRESSION: No acute abnormality. Electronically Signed   By: Macy Mis M.D.   On: 10/21/2021 20:38   ? ? ?  ?Domenic Moras,  PA-C ?10/21/21 2238 ? ?  ?Jeanell Sparrow, DO ?10/22/21 0017 ? ?

## 2021-10-21 NOTE — ED Provider Notes (Signed)
?[facility] ?Provider Note ? ? ?CSN: [id number] ?Arrival date & time: 10/21/21  1255 ? ?  ? ?History ? ?Chief Complaint  ?Patient presents with  ? Fall  ? ? ?WANDY BOSSLER is a 46 y.o. female. ? ? ?Fall ?Pertinent negatives include no chest pain and no abdominal pain.  ?46 year old female presents emergency department with history of fall.  Patient states that she was being attacked by a protective mother hen when she fell backwards, striking her sacrum on the edge of a brick.  She is currently endorsing left lower extremity numbness as well as radicular pain in the ipsilateral leg that radiates to foot which are both new since the fall.  She denies saddle anesthesia, bowel/bladder dysfunction, IV drug use, fever, recent steroid use, known cancer diagnosis.  She denies syncope, trauma to head, blood thinner use, chest pain, shortness of breath, headache/lightheadedness, abdominal pain, nausea/vomiting/diarrhea, urinary/vaginal symptoms, change in bowel habits. ? ?She has a history significant for closed pelvic ring fracture that was treated surgically on 04/22/2021 with internal fixation of bilateral SI joint. dural ectasia, chronic pelvic/sacral pain ? ?Home Medications ?Prior to Admission medications   ?Medication Sig Start Date End Date Taking? Authorizing Provider  ?buPROPion (WELLBUTRIN XL) 150 MG 24 hr tablet Take 1 tablet by mouth once daily 05/27/21   Janith Lima, MD  ?buPROPion (WELLBUTRIN XL) 300 MG 24 hr tablet TAKE 1 TABLET(300 MG) BY MOUTH IN THE MORNING 09/06/21   Janith Lima, MD  ?DULoxetine (CYMBALTA) 30 MG capsule Take 30 mg by mouth 2 (two) times daily. 10/01/21   [provider]  ?enoxaparin (LOVENOX) 40 MG/0.4ML injection Inject 40 mg into the skin daily. 04/23/21   [provider]  ?gabapentin (NEURONTIN) 600 MG tablet Take 600 mg by mouth at bedtime.  10/25/19   [provider]  ?HYDROmorphone (DILAUDID) 2 MG tablet Take 2  mg by mouth every 4 (four) hours as needed. 05/12/21   [provider]  ?ondansetron (ZOFRAN) 4 MG tablet Take 4 mg by mouth every 8 (eight) hours as needed. 04/23/21   [provider]  ?oxyCODONE (OXY IR/ROXICODONE) 5 MG immediate release tablet Take 5 mg by mouth every 6 (six) hours as needed for pain. 09/15/21   [provider]  ?pregabalin (LYRICA) 150 MG capsule Take 150 mg by mouth 3 (three) times daily. 09/24/21   [provider]  ?sertraline (ZOLOFT) 100 MG tablet TAKE 2 TABLETS(200 MG) BY MOUTH DAILY 09/06/21   Janith Lima, MD  ?topiramate (TOPAMAX) 200 MG tablet TAKE 1 TABLET(200 MG) BY MOUTH TWICE DAILY 09/06/21   Janith Lima, MD  ?   ? ?Allergies    ?Imitrex [sumatriptan]   ? ?Review of Systems   ?Review of Systems  ?Cardiovascular:  Negative for chest pain and palpitations.  ?Gastrointestinal:  Negative for abdominal pain, diarrhea, nausea and vomiting.  ?Genitourinary:   ?     Sacral/low back pain.  ? ?Physical Exam ?Updated Vital Signs ?BP (!) 106/55   Pulse 61   Temp 98.3 ?F (36.8 ?C) (Oral)   Resp 17   Ht '5\' 2"'$  (1.575 m)   Wt 54.4 kg   SpO2 99%   BMI 21.95 kg/m?  ?Physical Exam ? ?ED Results / Procedures / Treatments   ?Labs ?(all labs ordered are listed, but only abnormal results are displayed) ?Labs Reviewed - No data to display ? ?EKG ?None ? ?Radiology ?CT Lumbar Spine Wo Contrast ? ?  Result Date: 10/21/2021 ?CLINICAL DATA:  Low back pain after fall. EXAM: CT LUMBAR SPINE WITHOUT CONTRAST TECHNIQUE: Multidetector CT imaging of the lumbar spine was performed without intravenous contrast administration. Multiplanar CT image reconstructions were also generated. RADIATION DOSE REDUCTION: This exam was performed according to the departmental dose-optimization program which includes automated exposure control, adjustment of the mA and/or kV according to patient size and/or use of iterative reconstruction technique. COMPARISON:  MRI lumbar spine dated December 29, 2019. FINDINGS: Segmentation: 5 lumbar type vertebrae. Alignment: Normal. Vertebrae: New small acute nondisplaced fracture of the left anterior sacrum at S3-S4 (series 4, images 120 9-130). No additional fracture. 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 Paraspinal and other soft tissues: Prior screw fixation of the sacroiliac joints. Otherwise negative. Disc levels: Mild disc bulging at L4-L5. No spinal canal or neuroforaminal stenosis at any level. IMPRESSION: 1. New small acute nondisplaced fracture of the left anterior sacrum at S3-S4. No lumbar spine fracture. Electronically Signed   By: Titus Dubin M.D.   On: 10/21/2021 14:44  ? ?CT PELVIS WO CONTRAST ? ?Result Date: 10/21/2021 ?CLINICAL DATA:  Low back and sacral pain after fall. EXAM: CT PELVIS WITHOUT CONTRAST TECHNIQUE: Multidetector CT imaging of the pelvis was performed following the standard protocol without intravenous contrast. RADIATION DOSE REDUCTION: This exam was performed according to the departmental dose-optimization program which includes automated exposure control, adjustment of the mA and/or kV according to patient size and/or use of iterative reconstruction technique. COMPARISON:  MRI sacrum dated December 29, 2019. FINDINGS: Urinary Tract:  No abnormality visualized. Bowel:  Unremarkable visualized pelvic bowel loops. Vascular/Lymphatic: No pathologically enlarged lymph nodes. No significant vascular abnormality seen. Reproductive:  Uterus and bilateral adnexa are unremarkable. Other:  None. Musculoskeletal: New small acute nondisplaced fracture of the left anterior sacrum at S3-S4 (series 13, image 24). Prior screw fixation of the sacroiliac joints. No diastasis. 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 IMPRESSION: 1. New small acute nondisplaced fracture of the left anterior sacrum at S3-S4. Electronically Signed    By: Titus Dubin M.D.   On: 10/21/2021 15:21   ? ?Procedures ?Procedures  ? ? ?Medications Ordered in ED ?Medications  ?lidocaine (LIDODERM) 5 % 1 patch (1 patch Transdermal Patch Applied 10/21/21 1537)  ?acetaminophen (OFIRMEV) IV 1,000 mg (1,000 mg Intravenous New Bag/Given 10/21/21 1603)  ?morphine (PF) 4 MG/ML injection 4 mg (4 mg Intravenous Given 10/21/21 1412)  ? ? ?ED Course/ Medical Decision Making/ A&P ?Clinical Course as of 10/21/21 1621  ?Tue Oct 21, 2021  ?[facility] surgery was consulted but due to patient's sensory deficits and radicular pain, thought that the patient be better fit for neurosurgery.  Neurosurgery was consulted at this time. [CR]  ?  ?Clinical Course User Index ?[CR] Wilnette Kales, PA  ? ?                        ?Medical Decision Making ?Amount and/or Complexity of Data Reviewed ?Radiology: ordered. ? ?Risk ?Prescription drug management. ? ? ?This patient presents to the ED for concern of low back/sacral pain, this involves an extensive number of treatment options, and is a complaint that carries with it a high risk of complications and morbidity.  The differential diagnosis includes cauda equina, vertebral fracture, strain, laceration, pelvic fracture ? ? ?Co morbidities that complicate the patient evaluation ? ?  losed pelvic ring fracture that was treated surgically on 04/22/2021 with internal fixation of bilateral SI joint. dural ectasia, chronic pelvic/sacral pain ? ? ?Additional history obtained: ? ?Additional history obtained from surgical record from 04/22/2021 ?External records from outside source obtained and reviewed including internal fixation of bilateral SI joint ? ? ?Lab Tests: ? ?I Ordered, and personally interpreted labs.  The pertinent results include: Attempted to get beta-hCG on patient, but she refused ? ? ?Imaging Studies ordered: ? ?I ordered imaging studies including CT lumbar spine, CT pelvis ?I independently visualized and interpreted imaging which  showed  ?CT lumbar spine: New small acute nondisplaced fracture of the left anterior sacrum at S2-S3.  No additional fracture.  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

## 2021-10-21 NOTE — ED Triage Notes (Addendum)
Pt arrives via EMS. Pt was being attacked by a chicken pta and tripped and fell. Pt fell backwards and landed on a brick. Reports pain to lower back/sacrum.    No loc, no blood thinners, and AxOx 4. Pt received 15mg of fentanyl via Intranasal. Denies loss of control of bowel or bladder. Does report some numbness to left leg. Hx of Dural ectasia ? ?

## 2021-10-24 DIAGNOSIS — R69 Illness, unspecified: Secondary | ICD-10-CM | POA: Diagnosis not present

## 2021-10-28 ENCOUNTER — Ambulatory Visit: Payer: 59 | Admitting: Dermatology

## 2021-10-28 ENCOUNTER — Encounter: Payer: Self-pay | Admitting: Dermatology

## 2021-10-28 DIAGNOSIS — D2339 Other benign neoplasm of skin of other parts of face: Secondary | ICD-10-CM

## 2021-10-28 DIAGNOSIS — Z1283 Encounter for screening for malignant neoplasm of skin: Secondary | ICD-10-CM

## 2021-10-28 DIAGNOSIS — D1800 Hemangioma unspecified site: Secondary | ICD-10-CM | POA: Diagnosis not present

## 2021-10-28 DIAGNOSIS — D2330 Other benign neoplasm of skin of unspecified part of face: Secondary | ICD-10-CM

## 2021-11-07 DIAGNOSIS — R69 Illness, unspecified: Secondary | ICD-10-CM | POA: Diagnosis not present

## 2021-11-12 DIAGNOSIS — M48061 Spinal stenosis, lumbar region without neurogenic claudication: Secondary | ICD-10-CM | POA: Diagnosis not present

## 2021-11-21 DIAGNOSIS — R69 Illness, unspecified: Secondary | ICD-10-CM | POA: Diagnosis not present

## 2021-11-23 ENCOUNTER — Encounter: Payer: Self-pay | Admitting: Dermatology

## 2021-11-23 NOTE — Progress Notes (Signed)
   New Patient   Subjective  Vanessa Reese is a 46 y.o. female who presents for the following: Annual Exam (Patient here today for skin check, per patient she has a lesion under her left nostril x 4 years that she would like checked, per patient no bleeding, no pain. Per patient she has a dry lesion on her left cheek x years bleeding with picking. No personal history of atypical moles, melanoma or non mole skin cancer. Family history of atypical moles, and non mole skin cancer. ).  General skin examination, several spots to check Location:  Duration:  Quality:  Associated Signs/Symptoms: Modifying Factors:  Severity:  Timing: Context:    The following portions of the chart were reviewed this encounter and updated as appropriate:  Tobacco  Allergies  Meds  Problems  Med Hx  Surg Hx  Fam Hx      Objective  Well appearing patient in no apparent distress; mood and affect are within normal limits. General skin examination: No atypical pigmented lesions (all checked with dermoscopy).  Lesion below nostril better fits fibrous papule then [facility] but patient may choose to have this biopsied in future.  Multiple 1 mm smooth red dermal papules.  Left Upper Cutaneous Lip 2 mm domed pink papule, no dermoscopic atypia.  Discussed the possibility a small basal cell carcinoma even though the lesion has been stable for years.  As this lesion does bother the patient, we may choose to do punch excision with suture closure rather than shave.    A full examination was performed including scalp, head, eyes, ears, nose, lips, neck, chest, axillae, abdomen, back, buttocks, bilateral upper extremities, bilateral lower extremities, hands, feet, fingers, toes, fingernails, and toenails. All findings within normal limits unless otherwise noted below.  Patient beneath undergarments not fully examined.   Assessment & Plan  Encounter for screening for malignant neoplasm of skin  Annual skin  examination.  Encouraged to self examine twice annually.  Continue ultraviolet protection.  Hemangioma, unspecified site  No intervention necessary.  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  If it is stable it is reasonable to continue to observe.

## 2022-01-19 DIAGNOSIS — R69 Illness, unspecified: Secondary | ICD-10-CM | POA: Diagnosis not present

## 2022-01-26 DIAGNOSIS — R69 Illness, unspecified: Secondary | ICD-10-CM | POA: Diagnosis not present

## 2022-02-03 DIAGNOSIS — R69 Illness, unspecified: Secondary | ICD-10-CM | POA: Diagnosis not present

## 2022-02-10 DIAGNOSIS — R69 Illness, unspecified: Secondary | ICD-10-CM | POA: Diagnosis not present

## 2022-02-16 DIAGNOSIS — R69 Illness, unspecified: Secondary | ICD-10-CM | POA: Diagnosis not present

## 2022-02-22 NOTE — Progress Notes (Unsigned)
    Subjective:    Patient ID: Vanessa Reese, female    DOB: [birth year], 46 y.o.   MRN: [id number]      HPI Darly is here for No chief complaint on file.    Loss of appetite, fatigue -     Medications and allergies reviewed with patient and updated if appropriate.  Current Outpatient Medications on File Prior to Visit  Medication Sig Dispense Refill   buPROPion (WELLBUTRIN XL) 300 MG 24 hr tablet TAKE 1 TABLET(300 MG) BY MOUTH IN THE MORNING 90 tablet 1   DULoxetine (CYMBALTA) 30 MG capsule Take 30 mg by mouth 2 (two) times daily.     oxyCODONE (OXY IR/ROXICODONE) 5 MG immediate release tablet Take 5 mg by mouth every 6 (six) hours as needed for pain.     pregabalin (LYRICA) 150 MG capsule Take 150 mg by mouth 3 (three) times daily.     sertraline (ZOLOFT) 100 MG tablet TAKE 2 TABLETS(200 MG) BY MOUTH DAILY 180 tablet 1   topiramate (TOPAMAX) 200 MG tablet TAKE 1 TABLET(200 MG) BY MOUTH TWICE DAILY 180 tablet 0   No current facility-administered medications on file prior to visit.    Review of Systems     Objective:  There were no vitals filed for this visit. BP Readings from Last 3 Encounters:  10/21/21 105/62  06/17/21 139/89  06/04/21 107/72   Wt Readings from Last 3 Encounters:  10/21/21 120 lb (54.4 kg)  06/17/21 120 lb (54.4 kg)  06/04/21 120 lb (54.4 kg)   There is no height or weight on file to calculate BMI.    Physical Exam         Assessment & Plan:    See Problem List for Assessment and Plan of chronic medical problems.

## 2022-02-23 ENCOUNTER — Ambulatory Visit (INDEPENDENT_AMBULATORY_CARE_PROVIDER_SITE_OTHER): Payer: 59 | Admitting: Internal Medicine

## 2022-02-23 ENCOUNTER — Ambulatory Visit (INDEPENDENT_AMBULATORY_CARE_PROVIDER_SITE_OTHER): Payer: 59

## 2022-02-23 ENCOUNTER — Encounter: Payer: Self-pay | Admitting: Internal Medicine

## 2022-02-23 VITALS — BP 120/86 | HR 96 | Temp 98.4°F | Ht 62.0 in | Wt 122.0 lb

## 2022-02-23 DIAGNOSIS — K5900 Constipation, unspecified: Secondary | ICD-10-CM | POA: Diagnosis not present

## 2022-02-23 DIAGNOSIS — E538 Deficiency of other specified B group vitamins: Secondary | ICD-10-CM

## 2022-02-23 DIAGNOSIS — R5383 Other fatigue: Secondary | ICD-10-CM

## 2022-02-23 DIAGNOSIS — R1084 Generalized abdominal pain: Secondary | ICD-10-CM

## 2022-02-23 DIAGNOSIS — R110 Nausea: Secondary | ICD-10-CM | POA: Diagnosis not present

## 2022-02-23 LAB — CBC WITH DIFFERENTIAL/PLATELET
Basophils Absolute: 0.1 10*3/uL (ref 0.0–0.1)
Basophils Relative: 2.1 % (ref 0.0–3.0)
Eosinophils Absolute: 0.4 10*3/uL (ref 0.0–0.7)
Eosinophils Relative: 5.7 % — ABNORMAL HIGH (ref 0.0–5.0)
HCT: 40.6 % (ref 36.0–46.0)
Hemoglobin: 13.5 g/dL (ref 12.0–15.0)
Lymphocytes Relative: 32.4 % (ref 12.0–46.0)
Lymphs Abs: 2.1 10*3/uL (ref 0.7–4.0)
MCHC: 33.2 g/dL (ref 30.0–36.0)
MCV: 89.4 fl (ref 78.0–100.0)
Monocytes Absolute: 0.4 10*3/uL (ref 0.1–1.0)
Monocytes Relative: 5.8 % (ref 3.0–12.0)
Neutro Abs: 3.5 10*3/uL (ref 1.4–7.7)
Neutrophils Relative %: 54 % (ref 43.0–77.0)
Platelets: 265 10*3/uL (ref 150.0–400.0)
RBC: 4.54 Mil/uL (ref 3.87–5.11)
RDW: 13.6 % (ref 11.5–15.5)
WBC: 6.5 10*3/uL (ref 4.0–10.5)

## 2022-02-23 LAB — AMYLASE: Amylase: 82 U/L (ref 27–131)

## 2022-02-23 LAB — COMPREHENSIVE METABOLIC PANEL
ALT: 8 U/L (ref 0–35)
AST: 11 U/L (ref 0–37)
Albumin: 4.7 g/dL (ref 3.5–5.2)
Alkaline Phosphatase: 49 U/L (ref 39–117)
BUN: 17 mg/dL (ref 6–23)
CO2: 21 mEq/L (ref 19–32)
Calcium: 9.6 mg/dL (ref 8.4–10.5)
Chloride: 107 mEq/L (ref 96–112)
Creatinine, Ser: 0.97 mg/dL (ref 0.40–1.20)
GFR: 70.74 mL/min (ref >60.00)
Glucose, Bld: 83 mg/dL (ref 70–99)
Potassium: 3.9 mEq/L (ref 3.5–5.1)
Sodium: 140 mEq/L (ref 135–145)
Total Bilirubin: 0.3 mg/dL (ref 0.2–1.2)
Total Protein: 7.6 g/dL (ref 6.0–8.3)

## 2022-02-23 LAB — LIPASE: Lipase: 69 U/L — ABNORMAL HIGH (ref 11.0–59.0)

## 2022-02-23 LAB — VITAMIN B12: Vitamin B-12: 578 pg/mL (ref 211–911)

## 2022-02-23 LAB — TSH: TSH: 1.16 u[IU]/mL (ref 0.35–5.50)

## 2022-02-23 NOTE — Patient Instructions (Addendum)
     Blood work was ordered.  Have an abdominal xray downstairs.     Medications changes include :  start miralax daily until your bowels are normal.       Return if symptoms worsen or fail to improve.    [facility] [phone]) [facility] [phone]) [facility] on [address]. [address] [phone]), [facility] [phone]) [facility] [phone])

## 2022-02-24 DIAGNOSIS — R69 Illness, unspecified: Secondary | ICD-10-CM | POA: Diagnosis not present

## 2022-03-03 DIAGNOSIS — R69 Illness, unspecified: Secondary | ICD-10-CM | POA: Diagnosis not present

## 2022-03-10 DIAGNOSIS — R69 Illness, unspecified: Secondary | ICD-10-CM | POA: Diagnosis not present

## 2022-03-18 DIAGNOSIS — R300 Dysuria: Secondary | ICD-10-CM | POA: Diagnosis not present

## 2022-03-20 DIAGNOSIS — R69 Illness, unspecified: Secondary | ICD-10-CM | POA: Diagnosis not present

## 2022-03-24 DIAGNOSIS — S3210XA Unspecified fracture of sacrum, initial encounter for closed fracture: Secondary | ICD-10-CM | POA: Diagnosis not present

## 2022-03-24 DIAGNOSIS — W1839XA Other fall on same level, initial encounter: Secondary | ICD-10-CM | POA: Diagnosis not present

## 2022-03-31 DIAGNOSIS — R69 Illness, unspecified: Secondary | ICD-10-CM | POA: Diagnosis not present

## 2022-04-07 DIAGNOSIS — R69 Illness, unspecified: Secondary | ICD-10-CM | POA: Diagnosis not present

## 2022-04-11 DIAGNOSIS — J019 Acute sinusitis, unspecified: Secondary | ICD-10-CM | POA: Diagnosis not present

## 2022-04-11 DIAGNOSIS — J029 Acute pharyngitis, unspecified: Secondary | ICD-10-CM | POA: Diagnosis not present

## 2022-04-11 DIAGNOSIS — J209 Acute bronchitis, unspecified: Secondary | ICD-10-CM | POA: Diagnosis not present

## 2022-04-14 DIAGNOSIS — R69 Illness, unspecified: Secondary | ICD-10-CM | POA: Diagnosis not present

## 2022-04-18 ENCOUNTER — Other Ambulatory Visit: Payer: Self-pay | Admitting: Internal Medicine

## 2022-04-18 DIAGNOSIS — F324 Major depressive disorder, single episode, in partial remission: Secondary | ICD-10-CM

## 2022-04-20 DIAGNOSIS — R69 Illness, unspecified: Secondary | ICD-10-CM | POA: Diagnosis not present

## 2022-04-21 DIAGNOSIS — R69 Illness, unspecified: Secondary | ICD-10-CM | POA: Diagnosis not present

## 2022-04-22 ENCOUNTER — Encounter: Payer: Self-pay | Admitting: Internal Medicine

## 2022-04-22 ENCOUNTER — Ambulatory Visit (INDEPENDENT_AMBULATORY_CARE_PROVIDER_SITE_OTHER): Payer: 59

## 2022-04-22 ENCOUNTER — Ambulatory Visit (INDEPENDENT_AMBULATORY_CARE_PROVIDER_SITE_OTHER): Payer: 59 | Admitting: Internal Medicine

## 2022-04-22 VITALS — BP 112/74 | HR 74 | Temp 98.4°F | Ht 62.0 in | Wt 127.0 lb

## 2022-04-22 DIAGNOSIS — J4531 Mild persistent asthma with (acute) exacerbation: Secondary | ICD-10-CM

## 2022-04-22 DIAGNOSIS — J22 Unspecified acute lower respiratory infection: Secondary | ICD-10-CM | POA: Diagnosis not present

## 2022-04-22 DIAGNOSIS — R058 Other specified cough: Secondary | ICD-10-CM | POA: Insufficient documentation

## 2022-04-22 DIAGNOSIS — H6993 Unspecified Eustachian tube disorder, bilateral: Secondary | ICD-10-CM | POA: Insufficient documentation

## 2022-04-22 DIAGNOSIS — J45909 Unspecified asthma, uncomplicated: Secondary | ICD-10-CM | POA: Insufficient documentation

## 2022-04-22 DIAGNOSIS — Z23 Encounter for immunization: Secondary | ICD-10-CM | POA: Diagnosis not present

## 2022-04-22 DIAGNOSIS — R059 Cough, unspecified: Secondary | ICD-10-CM | POA: Diagnosis not present

## 2022-04-22 DIAGNOSIS — Z0001 Encounter for general adult medical examination with abnormal findings: Secondary | ICD-10-CM

## 2022-04-22 MED ORDER — MOXIFLOXACIN HCL 400 MG PO TABS: 400.0000 mg | ORAL_TABLET | Freq: Every day | ORAL | 0 refills | Status: AC

## 2022-04-22 MED ORDER — METHYLPREDNISOLONE ACETATE 80 MG/ML IJ SUSP
80.0000 mg | Freq: Once | INTRAMUSCULAR | Status: AC
  Administered 2022-04-22: 80 mg via INTRAMUSCULAR

## 2022-04-22 MED ORDER — PROMETHAZINE-DM 6.25-15 MG/5ML PO SYRP: 5.0000 mL | ORAL_SOLUTION | Freq: Four times a day (QID) | ORAL | 0 refills | Status: AC | PRN

## 2022-04-22 NOTE — Patient Instructions (Signed)

## 2022-04-22 NOTE — Progress Notes (Unsigned)
Subjective:  Patient ID: Vanessa Reese, female    DOB: [birth year]  Age: 46 y.o. MRN: [id number]  CC: Annual Exam and Cough   HPI Vanessa Reese presents for a CPX and f/up -  She complains of a 3 week hx of cough productive of thick, yellow phlegm with ear pain and wheezing. She was seen at an [facility] and has been taking augmentin with not much improvement. A CXR was not done.  Outpatient Medications Prior to Visit  Medication Sig Dispense Refill  . buPROPion (WELLBUTRIN XL) 300 MG 24 hr tablet TAKE 1 TABLET BY MOUTH IN THE MORNING 90 tablet 0  . DULoxetine (CYMBALTA) 30 MG capsule Take 30 mg by mouth 2 (two) times daily.    Marland Kitchen oxyCODONE (OXY IR/ROXICODONE) 5 MG immediate release tablet Take 5 mg by mouth every 6 (six) hours as needed for pain.    . pregabalin (LYRICA) 150 MG capsule Take 150 mg by mouth 3 (three) times daily.    . sertraline (ZOLOFT) 100 MG tablet TAKE 2 TABLETS(200 MG) BY MOUTH DAILY 180 tablet 1  . topiramate (TOPAMAX) 200 MG tablet TAKE 1 TABLET(200 MG) BY MOUTH TWICE DAILY 180 tablet 0   No facility-administered medications prior to visit.    ROS Review of Systems  Constitutional:  Negative for chills, diaphoresis, fatigue and fever.  HENT:  Positive for ear pain. Negative for sinus pressure, sneezing, sore throat, tinnitus and trouble swallowing.   Respiratory:  Positive for cough. Negative for chest tightness, shortness of breath and wheezing.   Cardiovascular:  Negative for chest pain, palpitations and leg swelling.  Gastrointestinal:  Negative for abdominal pain, constipation, diarrhea, nausea and vomiting.  Genitourinary: Negative.   Musculoskeletal: Negative.   Skin: Negative.   Neurological: Negative.  Negative for dizziness and weakness.  Hematological:  Negative for adenopathy. Does not bruise/bleed easily.  Psychiatric/Behavioral: Negative.      Objective:  BP 112/74   Pulse 74   Temp 98.4 F (36.9 C) (Oral)   Ht '5\' 2"'$  (1.575 m)   Wt 127  lb (57.6 kg)   LMP 03/30/2022   SpO2 98%   BMI 23.23 kg/m   BP Readings from Last 3 Encounters:  04/22/22 112/74  02/23/22 120/86  10/21/21 105/62    Wt Readings from Last 3 Encounters:  04/22/22 127 lb (57.6 kg)  02/23/22 122 lb (55.3 kg)  10/21/21 120 lb (54.4 kg)    Physical Exam Vitals reviewed.  Constitutional:      Appearance: She is not ill-appearing.  HENT:     Right Ear: Hearing, tympanic membrane, ear canal and external ear normal. No middle ear effusion. Tympanic membrane is not injected.     Left Ear: Hearing, tympanic membrane, ear canal and external ear normal.  No middle ear effusion. Tympanic membrane is not injected.     Nose: Nose normal.  Eyes:     General: No scleral icterus.    Conjunctiva/sclera: Conjunctivae normal.  Cardiovascular:     Rate and Rhythm: Normal rate and regular rhythm.     Heart sounds: No murmur heard.    No gallop.  Pulmonary:     Effort: Pulmonary effort is normal.     Breath sounds: Examination of the right-lower field reveals rhonchi. Rhonchi present. No decreased breath sounds, wheezing or rales.  Abdominal:     General: Abdomen is flat.     Palpations: There is no mass.     Tenderness: There is no abdominal tenderness.  There is no guarding.     Hernia: No hernia is present.  Musculoskeletal:        General: Normal range of motion.     Cervical back: Neck supple.     Right lower leg: No edema.     Left lower leg: No edema.  Lymphadenopathy:     Cervical: No cervical adenopathy.  Skin:    General: Skin is warm and dry.  Neurological:     General: No focal deficit present.     Mental Status: She is alert.  Psychiatric:        Mood and Affect: Mood normal.        Behavior: Behavior normal.    Lab Results  Component Value Date   WBC 6.5 02/23/2022   HGB 13.5 02/23/2022   HCT 40.6 02/23/2022   PLT 265.0 02/23/2022   GLUCOSE 83 02/23/2022   CHOL 239 (H) 04/10/2021   TRIG 118.0 04/10/2021   HDL 55.50 04/10/2021    LDLCALC 160 (H) 04/10/2021   ALT 8 02/23/2022   AST 11 02/23/2022   NA 140 02/23/2022   K 3.9 02/23/2022   CL 107 02/23/2022   CREATININE 0.97 02/23/2022   BUN 17 02/23/2022   CO2 21 02/23/2022   TSH 1.16 02/23/2022    MR LUMBAR SPINE WO CONTRAST  Result Date: 10/21/2021 CLINICAL DATA:  Low back pain, trauma EXAM: MRI LUMBAR SPINE WITHOUT CONTRAST TECHNIQUE: Multiplanar, multisequence MR imaging of the lumbar spine was performed. No intravenous contrast was administered. COMPARISON:  2021 FINDINGS: Segmentation:  Standard. Alignment:  Stable. Vertebrae: Stable vertebral body heights. Susceptibility artifact at the level of bilateral sacroiliac joint fixation spanning S1. No marrow edema. Marked scalloping of the dorsal margin of the sacrum particularly at S2 and S3 as before. This is secondary to dural ectasia as well as dural cysts at the sacral level. Conus medullaris and cauda equina: Conus extends to the L1-L2 level. Conus and cauda equina appear normal. Paraspinal and other soft tissues: Unremarkable. Disc levels: Similar disc bulge with punctate central annular fissure at L4-L5. No canal or foraminal stenosis. IMPRESSION: No acute abnormality. Electronically Signed   By: Macy Mis M.D.   On: 10/21/2021 20:38   CT PELVIS WO CONTRAST  Result Date: 10/21/2021 CLINICAL DATA:  Low back and sacral pain after fall. EXAM: CT PELVIS WITHOUT CONTRAST TECHNIQUE: Multidetector CT imaging of the pelvis was performed following the standard protocol without intravenous contrast. RADIATION DOSE REDUCTION: This exam was performed according to the departmental dose-optimization program which includes automated exposure control, adjustment of the mA and/or kV according to patient size and/or use of iterative reconstruction technique. COMPARISON:  MRI sacrum dated December 29, 2019. FINDINGS: Urinary Tract:  No abnormality visualized. Bowel:  Unremarkable visualized pelvic bowel loops. Vascular/Lymphatic: No  pathologically enlarged lymph nodes. No significant vascular abnormality seen. Reproductive:  Uterus and bilateral adnexa are unremarkable. Other:  None. Musculoskeletal: New small acute nondisplaced fracture of the left anterior sacrum at S3-S4 (series 13, image 24). Prior screw fixation of the sacroiliac joints. No diastasis. 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 IMPRESSION: 1. New small acute nondisplaced fracture of the left anterior sacrum at S3-S4. Electronically Signed   By: Titus Dubin M.D.   On: 10/21/2021 15:21   CT Lumbar Spine Wo Contrast  Result Date: 10/21/2021 CLINICAL DATA:  Low back pain after fall. EXAM: CT LUMBAR SPINE WITHOUT CONTRAST TECHNIQUE: Multidetector CT imaging  of the lumbar spine was performed without intravenous contrast administration. Multiplanar CT image reconstructions were also generated. RADIATION DOSE REDUCTION: This exam was performed according to the departmental dose-optimization program which includes automated exposure control, adjustment of the mA and/or kV according to patient size and/or use of iterative reconstruction technique. COMPARISON:  MRI lumbar spine dated December 29, 2019. FINDINGS: Segmentation: 5 lumbar type vertebrae. Alignment: Normal. Vertebrae: New small acute nondisplaced fracture of the left anterior sacrum at S3-S4 (series 4, images 120 9-130). No additional fracture. 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 Paraspinal and other soft tissues: Prior screw fixation of the sacroiliac joints. Otherwise negative. Disc levels: Mild disc bulging at L4-L5. No spinal canal or neuroforaminal stenosis at any level. IMPRESSION: 1. New small acute nondisplaced fracture of the left anterior sacrum at S3-S4. No lumbar spine fracture. Electronically Signed   By: Titus Dubin M.D.   On: 10/21/2021 14:44   DG Chest 2 View  Result  Date: 04/22/2022 CLINICAL DATA:  Cough for 3 weeks EXAM: CHEST - 2 VIEW COMPARISON:  None Available. FINDINGS: Normal mediastinum and cardiac silhouette. Normal pulmonary vasculature. No evidence of effusion, infiltrate, or pneumothorax. No acute bony abnormality. IMPRESSION: No acute cardiopulmonary process. Electronically Signed   By: Suzy Bouchard M.D.   On: 04/22/2022 08:41     Assessment & Plan:   Vanessa Reese was seen today for annual exam and cough.  Diagnoses and all orders for this visit:  Cough productive of yellow sputum- CXR is normal. Will treat with a FQ to cover atypical resp pathogens. -     DG Chest 2 View; Future -     promethazine-dextromethorphan (PROMETHAZINE-DM) 6.25-15 MG/5ML syrup; Take 5 mLs by mouth 4 (four) times daily as needed for up to 8 days for cough.  Eustachian tube dysfunction, bilateral- Will treat with steroids.  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  Need for vaccination -     Flu Vaccine QUAD 6+ mos PF IM (Fluarix Quad PF)  LRTI (lower respiratory tract infection) -     moxifloxacin (AVELOX) 400 MG tablet; Take 1 tablet (400 mg total) by mouth daily for 7 days. -     promethazine-dextromethorphan (PROMETHAZINE-DM) 6.25-15 MG/5ML syrup; Take 5 mLs by mouth 4 (four) times daily as needed for up to 8 days for cough.   I am having Vanessa Reese start on moxifloxacin and promethazine-dextromethorphan. I am also having her maintain her sertraline, topiramate, DULoxetine, oxyCODONE, pregabalin, and buPROPion. We administered methylPREDNISolone acetate.  Meds ordered this encounter  Medications  . moxifloxacin (AVELOX) 400 MG tablet    Sig: Take 1 tablet (400 mg total) by mouth daily for 7  days.    Dispense:  7 tablet    Refill:  0  . promethazine-dextromethorphan (PROMETHAZINE-DM) 6.25-15 MG/5ML syrup    Sig: Take 5 mLs by mouth 4 (four) times daily as needed for up to 8 days for cough.    Dispense:  118 mL    Refill:  0  . methylPREDNISolone acetate (DEPO-MEDROL) injection 80 mg     Follow-up: Return in about 6 months (around 10/21/2022).  Scarlette Calico, MD

## 2022-04-27 DIAGNOSIS — R69 Illness, unspecified: Secondary | ICD-10-CM | POA: Diagnosis not present

## 2022-05-05 DIAGNOSIS — R69 Illness, unspecified: Secondary | ICD-10-CM | POA: Diagnosis not present

## 2022-05-07 DIAGNOSIS — R69 Illness, unspecified: Secondary | ICD-10-CM | POA: Diagnosis not present

## 2022-05-11 DIAGNOSIS — R69 Illness, unspecified: Secondary | ICD-10-CM | POA: Diagnosis not present

## 2022-05-12 DIAGNOSIS — R69 Illness, unspecified: Secondary | ICD-10-CM | POA: Diagnosis not present

## 2022-05-18 DIAGNOSIS — R69 Illness, unspecified: Secondary | ICD-10-CM | POA: Diagnosis not present

## 2022-05-19 DIAGNOSIS — R69 Illness, unspecified: Secondary | ICD-10-CM | POA: Diagnosis not present

## 2022-05-20 DIAGNOSIS — X58XXXA Exposure to other specified factors, initial encounter: Secondary | ICD-10-CM | POA: Diagnosis not present

## 2022-05-20 DIAGNOSIS — R69 Illness, unspecified: Secondary | ICD-10-CM | POA: Diagnosis not present

## 2022-05-20 DIAGNOSIS — M533 Sacrococcygeal disorders, not elsewhere classified: Secondary | ICD-10-CM | POA: Diagnosis not present

## 2022-05-20 DIAGNOSIS — M792 Neuralgia and neuritis, unspecified: Secondary | ICD-10-CM | POA: Diagnosis not present

## 2022-05-20 DIAGNOSIS — M5137 Other intervertebral disc degeneration, lumbosacral region: Secondary | ICD-10-CM | POA: Diagnosis not present

## 2022-05-20 DIAGNOSIS — M549 Dorsalgia, unspecified: Secondary | ICD-10-CM | POA: Diagnosis not present

## 2022-05-20 DIAGNOSIS — M48061 Spinal stenosis, lumbar region without neurogenic claudication: Secondary | ICD-10-CM | POA: Diagnosis not present

## 2022-05-20 DIAGNOSIS — S3210XA Unspecified fracture of sacrum, initial encounter for closed fracture: Secondary | ICD-10-CM | POA: Diagnosis not present

## 2022-05-20 DIAGNOSIS — W1839XA Other fall on same level, initial encounter: Secondary | ICD-10-CM | POA: Diagnosis not present

## 2022-05-20 DIAGNOSIS — Z515 Encounter for palliative care: Secondary | ICD-10-CM | POA: Diagnosis not present

## 2022-05-20 DIAGNOSIS — M797 Fibromyalgia: Secondary | ICD-10-CM | POA: Diagnosis not present

## 2022-05-20 DIAGNOSIS — Y9302 Activity, running: Secondary | ICD-10-CM | POA: Diagnosis not present

## 2022-05-20 DIAGNOSIS — Z791 Long term (current) use of non-steroidal anti-inflammatories (NSAID): Secondary | ICD-10-CM | POA: Diagnosis not present

## 2022-05-25 DIAGNOSIS — R69 Illness, unspecified: Secondary | ICD-10-CM | POA: Diagnosis not present

## 2022-05-26 DIAGNOSIS — R69 Illness, unspecified: Secondary | ICD-10-CM | POA: Diagnosis not present

## 2022-06-09 DIAGNOSIS — R69 Illness, unspecified: Secondary | ICD-10-CM | POA: Diagnosis not present

## 2022-06-11 DIAGNOSIS — R69 Illness, unspecified: Secondary | ICD-10-CM | POA: Diagnosis not present

## 2022-06-15 ENCOUNTER — Telehealth: Payer: Self-pay | Admitting: Internal Medicine

## 2022-06-15 NOTE — Telephone Encounter (Signed)
Patient needs a referral sent to:  Dr. Collene Mares at [facility] - She also needs her last office notes sent.

## 2022-06-16 ENCOUNTER — Other Ambulatory Visit: Payer: Self-pay | Admitting: Internal Medicine

## 2022-06-16 DIAGNOSIS — Z1211 Encounter for screening for malignant neoplasm of colon: Secondary | ICD-10-CM

## 2022-06-16 DIAGNOSIS — R69 Illness, unspecified: Secondary | ICD-10-CM | POA: Diagnosis not present

## 2022-06-17 DIAGNOSIS — R69 Illness, unspecified: Secondary | ICD-10-CM | POA: Diagnosis not present

## 2022-06-23 ENCOUNTER — Other Ambulatory Visit: Payer: Self-pay | Admitting: Internal Medicine

## 2022-06-23 DIAGNOSIS — Z1231 Encounter for screening mammogram for malignant neoplasm of breast: Secondary | ICD-10-CM

## 2022-06-25 DIAGNOSIS — R69 Illness, unspecified: Secondary | ICD-10-CM | POA: Diagnosis not present

## 2022-06-26 DIAGNOSIS — R69 Illness, unspecified: Secondary | ICD-10-CM | POA: Diagnosis not present

## 2022-06-29 DIAGNOSIS — R69 Illness, unspecified: Secondary | ICD-10-CM | POA: Diagnosis not present

## 2022-07-03 ENCOUNTER — Ambulatory Visit: Payer: 59

## 2022-07-06 DIAGNOSIS — R69 Illness, unspecified: Secondary | ICD-10-CM | POA: Diagnosis not present

## 2022-07-07 DIAGNOSIS — R69 Illness, unspecified: Secondary | ICD-10-CM | POA: Diagnosis not present

## 2022-07-13 DIAGNOSIS — R69 Illness, unspecified: Secondary | ICD-10-CM | POA: Diagnosis not present

## 2022-07-14 DIAGNOSIS — R69 Illness, unspecified: Secondary | ICD-10-CM | POA: Diagnosis not present

## 2022-07-21 DIAGNOSIS — R69 Illness, unspecified: Secondary | ICD-10-CM | POA: Diagnosis not present

## 2022-07-22 DIAGNOSIS — R69 Illness, unspecified: Secondary | ICD-10-CM | POA: Diagnosis not present

## 2022-07-29 DIAGNOSIS — R69 Illness, unspecified: Secondary | ICD-10-CM | POA: Diagnosis not present

## 2022-07-30 DIAGNOSIS — R69 Illness, unspecified: Secondary | ICD-10-CM | POA: Diagnosis not present

## 2022-08-04 DIAGNOSIS — F329 Major depressive disorder, single episode, unspecified: Secondary | ICD-10-CM | POA: Diagnosis not present

## 2022-08-10 DIAGNOSIS — F329 Major depressive disorder, single episode, unspecified: Secondary | ICD-10-CM | POA: Diagnosis not present

## 2022-08-11 DIAGNOSIS — F329 Major depressive disorder, single episode, unspecified: Secondary | ICD-10-CM | POA: Diagnosis not present

## 2022-08-14 DIAGNOSIS — I341 Nonrheumatic mitral (valve) prolapse: Secondary | ICD-10-CM | POA: Diagnosis not present

## 2022-08-14 DIAGNOSIS — G96198 Other disorders of meninges, not elsewhere classified: Secondary | ICD-10-CM | POA: Diagnosis not present

## 2022-08-18 DIAGNOSIS — F329 Major depressive disorder, single episode, unspecified: Secondary | ICD-10-CM | POA: Diagnosis not present

## 2022-08-20 DIAGNOSIS — F329 Major depressive disorder, single episode, unspecified: Secondary | ICD-10-CM | POA: Diagnosis not present

## 2022-08-24 DIAGNOSIS — F329 Major depressive disorder, single episode, unspecified: Secondary | ICD-10-CM | POA: Diagnosis not present

## 2022-08-25 DIAGNOSIS — F329 Major depressive disorder, single episode, unspecified: Secondary | ICD-10-CM | POA: Diagnosis not present

## 2022-09-01 DIAGNOSIS — F329 Major depressive disorder, single episode, unspecified: Secondary | ICD-10-CM | POA: Diagnosis not present

## 2022-09-02 DIAGNOSIS — E782 Mixed hyperlipidemia: Secondary | ICD-10-CM | POA: Diagnosis not present

## 2022-09-02 DIAGNOSIS — I341 Nonrheumatic mitral (valve) prolapse: Secondary | ICD-10-CM | POA: Diagnosis not present

## 2022-09-03 DIAGNOSIS — F329 Major depressive disorder, single episode, unspecified: Secondary | ICD-10-CM | POA: Diagnosis not present

## 2022-09-09 DIAGNOSIS — F329 Major depressive disorder, single episode, unspecified: Secondary | ICD-10-CM | POA: Diagnosis not present

## 2022-09-10 DIAGNOSIS — F329 Major depressive disorder, single episode, unspecified: Secondary | ICD-10-CM | POA: Diagnosis not present

## 2022-09-15 DIAGNOSIS — F329 Major depressive disorder, single episode, unspecified: Secondary | ICD-10-CM | POA: Diagnosis not present

## 2022-09-16 DIAGNOSIS — I341 Nonrheumatic mitral (valve) prolapse: Secondary | ICD-10-CM | POA: Diagnosis not present

## 2022-09-17 DIAGNOSIS — F329 Major depressive disorder, single episode, unspecified: Secondary | ICD-10-CM | POA: Diagnosis not present

## 2022-09-22 DIAGNOSIS — F329 Major depressive disorder, single episode, unspecified: Secondary | ICD-10-CM | POA: Diagnosis not present

## 2022-09-23 DIAGNOSIS — F329 Major depressive disorder, single episode, unspecified: Secondary | ICD-10-CM | POA: Diagnosis not present

## 2022-09-28 DIAGNOSIS — F329 Major depressive disorder, single episode, unspecified: Secondary | ICD-10-CM | POA: Diagnosis not present

## 2022-09-29 DIAGNOSIS — F329 Major depressive disorder, single episode, unspecified: Secondary | ICD-10-CM | POA: Diagnosis not present

## 2022-10-07 DIAGNOSIS — F329 Major depressive disorder, single episode, unspecified: Secondary | ICD-10-CM | POA: Diagnosis not present

## 2022-10-08 DIAGNOSIS — F329 Major depressive disorder, single episode, unspecified: Secondary | ICD-10-CM | POA: Diagnosis not present

## 2022-10-12 DIAGNOSIS — F329 Major depressive disorder, single episode, unspecified: Secondary | ICD-10-CM | POA: Diagnosis not present

## 2022-10-14 DIAGNOSIS — F329 Major depressive disorder, single episode, unspecified: Secondary | ICD-10-CM | POA: Diagnosis not present

## 2022-10-19 DIAGNOSIS — F329 Major depressive disorder, single episode, unspecified: Secondary | ICD-10-CM | POA: Diagnosis not present

## 2022-10-20 DIAGNOSIS — F329 Major depressive disorder, single episode, unspecified: Secondary | ICD-10-CM | POA: Diagnosis not present

## 2022-10-26 DIAGNOSIS — F329 Major depressive disorder, single episode, unspecified: Secondary | ICD-10-CM | POA: Diagnosis not present

## 2022-10-27 DIAGNOSIS — F329 Major depressive disorder, single episode, unspecified: Secondary | ICD-10-CM | POA: Diagnosis not present

## 2022-11-03 ENCOUNTER — Ambulatory Visit: Payer: 59 | Admitting: Dermatology

## 2022-11-03 DIAGNOSIS — F329 Major depressive disorder, single episode, unspecified: Secondary | ICD-10-CM | POA: Diagnosis not present

## 2022-11-06 DIAGNOSIS — F329 Major depressive disorder, single episode, unspecified: Secondary | ICD-10-CM | POA: Diagnosis not present

## 2022-11-10 DIAGNOSIS — F329 Major depressive disorder, single episode, unspecified: Secondary | ICD-10-CM | POA: Diagnosis not present

## 2022-11-17 DIAGNOSIS — F329 Major depressive disorder, single episode, unspecified: Secondary | ICD-10-CM | POA: Diagnosis not present

## 2022-11-23 DIAGNOSIS — F329 Major depressive disorder, single episode, unspecified: Secondary | ICD-10-CM | POA: Diagnosis not present

## 2022-11-24 DIAGNOSIS — F329 Major depressive disorder, single episode, unspecified: Secondary | ICD-10-CM | POA: Diagnosis not present

## 2022-11-30 DIAGNOSIS — F329 Major depressive disorder, single episode, unspecified: Secondary | ICD-10-CM | POA: Diagnosis not present

## 2022-12-01 DIAGNOSIS — F329 Major depressive disorder, single episode, unspecified: Secondary | ICD-10-CM | POA: Diagnosis not present

## 2022-12-07 DIAGNOSIS — F329 Major depressive disorder, single episode, unspecified: Secondary | ICD-10-CM | POA: Diagnosis not present

## 2022-12-08 DIAGNOSIS — F329 Major depressive disorder, single episode, unspecified: Secondary | ICD-10-CM | POA: Diagnosis not present

## 2022-12-14 DIAGNOSIS — F329 Major depressive disorder, single episode, unspecified: Secondary | ICD-10-CM | POA: Diagnosis not present

## 2022-12-15 DIAGNOSIS — F329 Major depressive disorder, single episode, unspecified: Secondary | ICD-10-CM | POA: Diagnosis not present

## 2022-12-21 DIAGNOSIS — F329 Major depressive disorder, single episode, unspecified: Secondary | ICD-10-CM | POA: Diagnosis not present

## 2022-12-22 DIAGNOSIS — F329 Major depressive disorder, single episode, unspecified: Secondary | ICD-10-CM | POA: Diagnosis not present

## 2022-12-29 DIAGNOSIS — F329 Major depressive disorder, single episode, unspecified: Secondary | ICD-10-CM | POA: Diagnosis not present

## 2023-01-04 DIAGNOSIS — F329 Major depressive disorder, single episode, unspecified: Secondary | ICD-10-CM | POA: Diagnosis not present

## 2023-01-05 DIAGNOSIS — F329 Major depressive disorder, single episode, unspecified: Secondary | ICD-10-CM | POA: Diagnosis not present

## 2023-01-11 DIAGNOSIS — F329 Major depressive disorder, single episode, unspecified: Secondary | ICD-10-CM | POA: Diagnosis not present

## 2023-01-13 DIAGNOSIS — F329 Major depressive disorder, single episode, unspecified: Secondary | ICD-10-CM | POA: Diagnosis not present

## 2023-01-18 DIAGNOSIS — F329 Major depressive disorder, single episode, unspecified: Secondary | ICD-10-CM | POA: Diagnosis not present

## 2023-01-20 DIAGNOSIS — S3210XA Unspecified fracture of sacrum, initial encounter for closed fracture: Secondary | ICD-10-CM | POA: Diagnosis not present

## 2023-01-20 DIAGNOSIS — F329 Major depressive disorder, single episode, unspecified: Secondary | ICD-10-CM | POA: Diagnosis not present

## 2023-01-20 DIAGNOSIS — W1839XA Other fall on same level, initial encounter: Secondary | ICD-10-CM | POA: Diagnosis not present

## 2023-01-20 DIAGNOSIS — Z515 Encounter for palliative care: Secondary | ICD-10-CM | POA: Diagnosis not present

## 2023-01-20 DIAGNOSIS — Y9302 Activity, running: Secondary | ICD-10-CM | POA: Diagnosis not present

## 2023-01-20 DIAGNOSIS — F32A Depression, unspecified: Secondary | ICD-10-CM | POA: Diagnosis not present

## 2023-01-20 DIAGNOSIS — G8929 Other chronic pain: Secondary | ICD-10-CM | POA: Diagnosis not present

## 2023-01-25 DIAGNOSIS — F329 Major depressive disorder, single episode, unspecified: Secondary | ICD-10-CM | POA: Diagnosis not present

## 2023-01-27 DIAGNOSIS — F329 Major depressive disorder, single episode, unspecified: Secondary | ICD-10-CM | POA: Diagnosis not present

## 2023-02-01 DIAGNOSIS — F329 Major depressive disorder, single episode, unspecified: Secondary | ICD-10-CM | POA: Diagnosis not present

## 2023-02-09 DIAGNOSIS — F329 Major depressive disorder, single episode, unspecified: Secondary | ICD-10-CM | POA: Diagnosis not present

## 2023-02-15 DIAGNOSIS — F329 Major depressive disorder, single episode, unspecified: Secondary | ICD-10-CM | POA: Diagnosis not present

## 2023-02-22 DIAGNOSIS — F329 Major depressive disorder, single episode, unspecified: Secondary | ICD-10-CM | POA: Diagnosis not present

## 2023-03-01 DIAGNOSIS — F329 Major depressive disorder, single episode, unspecified: Secondary | ICD-10-CM | POA: Diagnosis not present

## 2023-03-03 DIAGNOSIS — F329 Major depressive disorder, single episode, unspecified: Secondary | ICD-10-CM | POA: Diagnosis not present

## 2023-03-08 DIAGNOSIS — F329 Major depressive disorder, single episode, unspecified: Secondary | ICD-10-CM | POA: Diagnosis not present

## 2023-03-10 DIAGNOSIS — F329 Major depressive disorder, single episode, unspecified: Secondary | ICD-10-CM | POA: Diagnosis not present

## 2023-03-22 DIAGNOSIS — F329 Major depressive disorder, single episode, unspecified: Secondary | ICD-10-CM | POA: Diagnosis not present

## 2023-03-24 DIAGNOSIS — F329 Major depressive disorder, single episode, unspecified: Secondary | ICD-10-CM | POA: Diagnosis not present

## 2023-03-29 DIAGNOSIS — F329 Major depressive disorder, single episode, unspecified: Secondary | ICD-10-CM | POA: Diagnosis not present

## 2023-04-05 DIAGNOSIS — F329 Major depressive disorder, single episode, unspecified: Secondary | ICD-10-CM | POA: Diagnosis not present

## 2023-04-07 DIAGNOSIS — F329 Major depressive disorder, single episode, unspecified: Secondary | ICD-10-CM | POA: Diagnosis not present

## 2023-04-13 DIAGNOSIS — F329 Major depressive disorder, single episode, unspecified: Secondary | ICD-10-CM | POA: Diagnosis not present

## 2023-04-19 DIAGNOSIS — F329 Major depressive disorder, single episode, unspecified: Secondary | ICD-10-CM | POA: Diagnosis not present

## 2023-04-21 DIAGNOSIS — F329 Major depressive disorder, single episode, unspecified: Secondary | ICD-10-CM | POA: Diagnosis not present

## 2023-04-27 DIAGNOSIS — Z6823 Body mass index (BMI) 23.0-23.9, adult: Secondary | ICD-10-CM | POA: Diagnosis not present

## 2023-04-27 DIAGNOSIS — H00022 Hordeolum internum right lower eyelid: Secondary | ICD-10-CM | POA: Diagnosis not present

## 2023-05-04 DIAGNOSIS — F329 Major depressive disorder, single episode, unspecified: Secondary | ICD-10-CM | POA: Diagnosis not present

## 2023-05-10 DIAGNOSIS — F329 Major depressive disorder, single episode, unspecified: Secondary | ICD-10-CM | POA: Diagnosis not present

## 2023-05-19 DIAGNOSIS — F329 Major depressive disorder, single episode, unspecified: Secondary | ICD-10-CM | POA: Diagnosis not present

## 2023-05-26 DIAGNOSIS — F3289 Other specified depressive episodes: Secondary | ICD-10-CM | POA: Diagnosis not present

## 2023-05-26 DIAGNOSIS — Z87891 Personal history of nicotine dependence: Secondary | ICD-10-CM | POA: Diagnosis not present

## 2023-05-26 DIAGNOSIS — S3210XA Unspecified fracture of sacrum, initial encounter for closed fracture: Secondary | ICD-10-CM | POA: Diagnosis not present

## 2023-05-26 DIAGNOSIS — W108XXA Fall (on) (from) other stairs and steps, initial encounter: Secondary | ICD-10-CM | POA: Diagnosis not present

## 2023-05-26 DIAGNOSIS — E782 Mixed hyperlipidemia: Secondary | ICD-10-CM | POA: Diagnosis not present

## 2023-05-26 DIAGNOSIS — M4316 Spondylolisthesis, lumbar region: Secondary | ICD-10-CM | POA: Diagnosis not present

## 2023-05-26 DIAGNOSIS — M792 Neuralgia and neuritis, unspecified: Secondary | ICD-10-CM | POA: Diagnosis not present

## 2023-05-26 DIAGNOSIS — G96198 Other disorders of meninges, not elsewhere classified: Secondary | ICD-10-CM | POA: Diagnosis not present

## 2023-05-26 DIAGNOSIS — M533 Sacrococcygeal disorders, not elsewhere classified: Secondary | ICD-10-CM | POA: Diagnosis not present

## 2023-05-27 DIAGNOSIS — F329 Major depressive disorder, single episode, unspecified: Secondary | ICD-10-CM | POA: Diagnosis not present

## 2023-06-03 DIAGNOSIS — M8000XD Age-related osteoporosis with current pathological fracture, unspecified site, subsequent encounter for fracture with routine healing: Secondary | ICD-10-CM | POA: Diagnosis not present

## 2023-06-07 DIAGNOSIS — F329 Major depressive disorder, single episode, unspecified: Secondary | ICD-10-CM | POA: Diagnosis not present

## 2023-07-01 DIAGNOSIS — Z6824 Body mass index (BMI) 24.0-24.9, adult: Secondary | ICD-10-CM | POA: Diagnosis not present

## 2023-07-01 DIAGNOSIS — J111 Influenza due to unidentified influenza virus with other respiratory manifestations: Secondary | ICD-10-CM | POA: Diagnosis not present

## 2023-09-15 DIAGNOSIS — Z6824 Body mass index (BMI) 24.0-24.9, adult: Secondary | ICD-10-CM | POA: Diagnosis not present

## 2023-09-15 DIAGNOSIS — H00012 Hordeolum externum right lower eyelid: Secondary | ICD-10-CM | POA: Diagnosis not present

## 2023-09-22 DIAGNOSIS — W108XXA Fall (on) (from) other stairs and steps, initial encounter: Secondary | ICD-10-CM | POA: Diagnosis not present

## 2023-09-22 DIAGNOSIS — M792 Neuralgia and neuritis, unspecified: Secondary | ICD-10-CM | POA: Diagnosis not present

## 2023-09-22 DIAGNOSIS — G43809 Other migraine, not intractable, without status migrainosus: Secondary | ICD-10-CM | POA: Diagnosis not present

## 2023-09-22 DIAGNOSIS — S3210XA Unspecified fracture of sacrum, initial encounter for closed fracture: Secondary | ICD-10-CM | POA: Diagnosis not present

## 2023-12-08 DIAGNOSIS — S3210XA Unspecified fracture of sacrum, initial encounter for closed fracture: Secondary | ICD-10-CM | POA: Diagnosis not present

## 2023-12-08 DIAGNOSIS — X58XXXA Exposure to other specified factors, initial encounter: Secondary | ICD-10-CM | POA: Diagnosis not present

## 2023-12-08 DIAGNOSIS — M792 Neuralgia and neuritis, unspecified: Secondary | ICD-10-CM | POA: Diagnosis not present

## 2023-12-08 DIAGNOSIS — M533 Sacrococcygeal disorders, not elsewhere classified: Secondary | ICD-10-CM | POA: Diagnosis not present

## 2024-01-12 DIAGNOSIS — S32810D Multiple fractures of pelvis with stable disruption of pelvic ring, subsequent encounter for fracture with routine healing: Secondary | ICD-10-CM | POA: Diagnosis not present

## 2024-01-12 DIAGNOSIS — M818 Other osteoporosis without current pathological fracture: Secondary | ICD-10-CM | POA: Diagnosis not present

## 2024-01-12 DIAGNOSIS — Z1331 Encounter for screening for depression: Secondary | ICD-10-CM | POA: Diagnosis not present

## 2024-01-12 DIAGNOSIS — E559 Vitamin D deficiency, unspecified: Secondary | ICD-10-CM | POA: Diagnosis not present

## 2024-01-12 DIAGNOSIS — G96198 Other disorders of meninges, not elsewhere classified: Secondary | ICD-10-CM | POA: Diagnosis not present

## 2024-01-12 DIAGNOSIS — X58XXXS Exposure to other specified factors, sequela: Secondary | ICD-10-CM | POA: Diagnosis not present

## 2024-01-12 DIAGNOSIS — X58XXXD Exposure to other specified factors, subsequent encounter: Secondary | ICD-10-CM | POA: Diagnosis not present

## 2024-01-12 DIAGNOSIS — S32810S Multiple fractures of pelvis with stable disruption of pelvic ring, sequela: Secondary | ICD-10-CM | POA: Diagnosis not present

## 2024-01-20 DIAGNOSIS — S32810S Multiple fractures of pelvis with stable disruption of pelvic ring, sequela: Secondary | ICD-10-CM | POA: Diagnosis not present

## 2024-01-20 DIAGNOSIS — G96198 Other disorders of meninges, not elsewhere classified: Secondary | ICD-10-CM | POA: Diagnosis not present

## 2024-01-20 DIAGNOSIS — M818 Other osteoporosis without current pathological fracture: Secondary | ICD-10-CM | POA: Diagnosis not present

## 2024-02-14 DIAGNOSIS — D492 Neoplasm of unspecified behavior of bone, soft tissue, and skin: Secondary | ICD-10-CM | POA: Diagnosis not present

## 2024-02-16 DIAGNOSIS — G96198 Other disorders of meninges, not elsewhere classified: Secondary | ICD-10-CM | POA: Diagnosis not present

## 2024-02-16 DIAGNOSIS — M7989 Other specified soft tissue disorders: Secondary | ICD-10-CM | POA: Diagnosis not present

## 2024-03-01 DIAGNOSIS — R222 Localized swelling, mass and lump, trunk: Secondary | ICD-10-CM | POA: Diagnosis not present

## 2024-03-01 DIAGNOSIS — F4389 Other reactions to severe stress: Secondary | ICD-10-CM | POA: Diagnosis not present

## 2024-03-01 DIAGNOSIS — L988 Other specified disorders of the skin and subcutaneous tissue: Secondary | ICD-10-CM | POA: Diagnosis not present

## 2024-03-02 DIAGNOSIS — G9589 Other specified diseases of spinal cord: Secondary | ICD-10-CM | POA: Diagnosis not present

## 2024-03-02 DIAGNOSIS — G96198 Other disorders of meninges, not elsewhere classified: Secondary | ICD-10-CM | POA: Diagnosis not present

## 2024-03-02 DIAGNOSIS — L988 Other specified disorders of the skin and subcutaneous tissue: Secondary | ICD-10-CM | POA: Diagnosis not present
